# Patient Record
Sex: Female | Born: 1935 | Race: Black or African American | Hispanic: No | Marital: Single | Smoking: Never smoker
Health system: Southern US, Community
[De-identification: ages and names within clinical notes are randomized; demographics above are authoritative.]

## PROBLEM LIST (undated history)

## (undated) DIAGNOSIS — K219 Gastro-esophageal reflux disease without esophagitis: Secondary | ICD-10-CM

## (undated) DIAGNOSIS — M353 Polymyalgia rheumatica: Secondary | ICD-10-CM

## (undated) DIAGNOSIS — R011 Cardiac murmur, unspecified: Secondary | ICD-10-CM

## (undated) DIAGNOSIS — G629 Polyneuropathy, unspecified: Secondary | ICD-10-CM

## (undated) DIAGNOSIS — M316 Other giant cell arteritis: Secondary | ICD-10-CM

## (undated) DIAGNOSIS — E278 Other specified disorders of adrenal gland: Secondary | ICD-10-CM

## (undated) DIAGNOSIS — I5189 Other ill-defined heart diseases: Secondary | ICD-10-CM

## (undated) DIAGNOSIS — M8589 Other specified disorders of bone density and structure, multiple sites: Secondary | ICD-10-CM

## (undated) DIAGNOSIS — Z7960 Long term (current) use of unspecified immunomodulators and immunosuppressants: Secondary | ICD-10-CM

## (undated) DIAGNOSIS — Z87442 Personal history of urinary calculi: Secondary | ICD-10-CM

## (undated) DIAGNOSIS — Z7902 Long term (current) use of antithrombotics/antiplatelets: Secondary | ICD-10-CM

## (undated) DIAGNOSIS — I10 Essential (primary) hypertension: Secondary | ICD-10-CM

## (undated) DIAGNOSIS — R609 Edema, unspecified: Secondary | ICD-10-CM

## (undated) DIAGNOSIS — R0789 Other chest pain: Secondary | ICD-10-CM

## (undated) DIAGNOSIS — N200 Calculus of kidney: Secondary | ICD-10-CM

## (undated) DIAGNOSIS — Z79899 Other long term (current) drug therapy: Secondary | ICD-10-CM

## (undated) DIAGNOSIS — I700 Atherosclerosis of aorta: Secondary | ICD-10-CM

## (undated) DIAGNOSIS — E119 Type 2 diabetes mellitus without complications: Secondary | ICD-10-CM

## (undated) DIAGNOSIS — E785 Hyperlipidemia, unspecified: Secondary | ICD-10-CM

## (undated) DIAGNOSIS — M109 Gout, unspecified: Secondary | ICD-10-CM

## (undated) DIAGNOSIS — R600 Localized edema: Secondary | ICD-10-CM

## (undated) HISTORY — PX: SMALL INTESTINE SURGERY: SHX150

---

## 2005-02-03 ENCOUNTER — Emergency Department: Payer: Self-pay | Admitting: Unknown Physician Specialty

## 2009-03-27 ENCOUNTER — Observation Stay: Payer: Self-pay | Admitting: Internal Medicine

## 2009-05-04 ENCOUNTER — Ambulatory Visit: Payer: Self-pay | Admitting: Internal Medicine

## 2009-05-12 ENCOUNTER — Ambulatory Visit: Payer: Self-pay | Admitting: Gastroenterology

## 2009-07-14 ENCOUNTER — Ambulatory Visit: Payer: Self-pay | Admitting: Gastroenterology

## 2009-10-11 ENCOUNTER — Ambulatory Visit: Payer: Self-pay | Admitting: Otolaryngology

## 2009-10-28 ENCOUNTER — Ambulatory Visit: Payer: Self-pay | Admitting: Gastroenterology

## 2010-01-10 ENCOUNTER — Ambulatory Visit: Payer: Self-pay | Admitting: Otolaryngology

## 2011-01-12 ENCOUNTER — Ambulatory Visit: Payer: Self-pay | Admitting: Otolaryngology

## 2012-07-09 ENCOUNTER — Ambulatory Visit: Payer: Self-pay | Admitting: Internal Medicine

## 2014-10-22 ENCOUNTER — Ambulatory Visit: Payer: Self-pay | Admitting: Internal Medicine

## 2017-06-23 ENCOUNTER — Encounter: Payer: Self-pay | Admitting: Emergency Medicine

## 2017-06-23 ENCOUNTER — Emergency Department
Admission: EM | Admit: 2017-06-23 | Discharge: 2017-06-23 | Disposition: A | Discharge status: Home / Self Care | Payer: Medicare Other | Attending: Emergency Medicine | Admitting: Emergency Medicine

## 2017-06-23 DIAGNOSIS — E119 Type 2 diabetes mellitus without complications: Secondary | ICD-10-CM | POA: Insufficient documentation

## 2017-06-23 DIAGNOSIS — L237 Allergic contact dermatitis due to plants, except food: Secondary | ICD-10-CM | POA: Insufficient documentation

## 2017-06-23 DIAGNOSIS — R21 Rash and other nonspecific skin eruption: Secondary | ICD-10-CM | POA: Diagnosis present

## 2017-06-23 DIAGNOSIS — I10 Essential (primary) hypertension: Secondary | ICD-10-CM | POA: Insufficient documentation

## 2017-06-23 HISTORY — DX: Type 2 diabetes mellitus without complications: E11.9

## 2017-06-23 HISTORY — DX: Essential (primary) hypertension: I10

## 2017-06-23 LAB — GLUCOSE, CAPILLARY: GLUCOSE-CAPILLARY: 102 mg/dL — AB (ref 65–99)

## 2017-06-23 MED ORDER — DEXAMETHASONE SODIUM PHOSPHATE 10 MG/ML IJ SOLN
10.0000 mg | Freq: Once | INTRAMUSCULAR | Status: AC
  Administered 2017-06-23: 10 mg via INTRAMUSCULAR
  Filled 2017-06-23: qty 1

## 2017-06-23 MED ORDER — HYDROXYZINE HCL 10 MG/5ML PO SYRP: 7.5000 mg | ORAL_SOLUTION | Freq: Three times a day (TID) | ORAL | 0 refills | Status: DC | PRN

## 2017-06-23 MED ORDER — HYDROXYZINE HCL 25 MG PO TABS: 25.0000 mg | ORAL_TABLET | Freq: Three times a day (TID) | ORAL | 0 refills | Status: DC | PRN

## 2017-06-23 MED ORDER — HYDROCORTISONE VALERATE 0.2 % EX OINT: TOPICAL_OINTMENT | CUTANEOUS | 0 refills | Status: AC

## 2017-06-23 MED ORDER — HYDROXYZINE HCL 50 MG PO TABS
50.0000 mg | ORAL_TABLET | Freq: Once | ORAL | Status: AC
  Administered 2017-06-23: 50 mg via ORAL
  Filled 2017-06-23: qty 1

## 2017-06-23 NOTE — ED Provider Notes (Signed)
[facility] Emergency Department Provider Note  ____________________________________________   First MD Initiated Contact with Patient 06/23/17 [phone]     (approximate)  I have reviewed the triage vital signs and the nursing notes.   HISTORY  Chief Complaint Rash    HPI Valerie Cochran is a 83 y.o. female patient complaining of itchy/burning rash on the upper left arm,left shoulder, left lateral neck, and right ankle. Patient state her rash for 2 days status post working in the yard 4 days ago. No palliative measures for complaint.   Past Medical History:  Diagnosis Date  . Diabetes mellitus without complication ([place])   . Hypertension     There are no active problems to display for this patient.   Past Surgical History:  Procedure Laterality Date  . SMALL INTESTINE SURGERY      Prior to Admission medications   Medication Sig Start Date End Date Taking? Authorizing Provider  hydrocortisone valerate ointment (WESTCORT) 0.2 % Apply to affected area daily 06/23/17 06/23/18  Sable Feil, PA-C  hydrOXYzine (ATARAX/VISTARIL) 25 MG tablet Take 1 tablet (25 mg total) by mouth 3 (three) times daily as needed. 06/23/17   Sable Feil, PA-C    Allergies Aspirin  No family history on file.  Social History Social History  Substance Use Topics  . Smoking status: Never Smoker  . Smokeless tobacco: Never Used  . Alcohol use No    Review of Systems  Constitutional: No fever/chills Eyes: No visual changes. ENT: No sore throat. Cardiovascular: Denies chest pain. Respiratory: Denies shortness of breath. Gastrointestinal: No abdominal pain.  No nausea, no vomiting.  No diarrhea.  No constipation. Genitourinary: Negative for dysuria. Musculoskeletal: Negative for back pain. Skin: Negative for rash. Neurological: Negative for headaches, focal weakness or numbness. Endocrine:Diabetes and hypertension Allergic/Immunilogical: Aspirin  ____________________________________________   PHYSICAL EXAM:  VITAL SIGNS: ED Triage Vitals [06/23/17 0909]  Enc Vitals Group     BP (!) 151/74     Pulse Rate 81     Resp 16     Temp 98 F (36.7 C)     Temp Source Oral     SpO2 98 %     Weight      Height      Head Circumference      Peak Flow      Pain Score      Pain Loc      Pain Edu?      Excl. in [place]?     Constitutional: Alert and oriented. Well appearing and in no acute distress. Cardiovascular: Normal rate, regular rhythm. Grossly normal heart sounds.  Good peripheral circulation. Elevated blood pressure Respiratory: Normal respiratory effort.  No retractions. Lungs CTAB. Musculoskeletal: No lower extremity tenderness nor edema.  No joint effusions. Neurologic:  Normal speech and language. No gross focal neurologic deficits are appreciated. No gait instability. Skin:  Skin is warm, dry and intact. Vesicle lesions on left upper extremity and right leg. Psychiatric: Mood and affect are normal. Speech and behavior are normal.  ____________________________________________   LABS (all labs ordered are listed, but only abnormal results are displayed)  Labs Reviewed  CBG MONITORING, ED   ____________________________________________  EKG   ____________________________________________  RADIOLOGY  No results found.  ____________________________________________   PROCEDURES  Procedure(s) performed: None  Procedures  Critical Care performed: No  ____________________________________________   INITIAL IMPRESSION / ASSESSMENT AND PLAN / ED COURSE  Pertinent labs & imaging results that were available during my  care of the patient were reviewed by me and considered in my medical decision making (see chart for details).  Contact dermatitis. Patient given discharge care instructions. Patient advised to take medications as directed. Patient Accu-Chek showed glucose level 102.  Follow-up with PCP in 3-5 days.  Advised patient to closely monitor her glucose levels due to Decadron given prior to departure. Patient given a prescription for Atarax and Westcort.      ____________________________________________   FINAL CLINICAL IMPRESSION(S) / ED DIAGNOSES  Final diagnoses:  Allergic contact dermatitis due to plants, except food      NEW MEDICATIONS STARTED DURING THIS VISIT:  New Prescriptions   HYDROCORTISONE VALERATE OINTMENT (WESTCORT) 0.2 %    Apply to affected area daily   HYDROXYZINE (ATARAX/VISTARIL) 25 MG TABLET    Take 1 tablet (25 mg total) by mouth 3 (three) times daily as needed.     Note:  This document was prepared using Dragon voice recognition software and may include unintentional dictation errors.    Sable Feil, PA-C 06/23/17 [place], [place], MD 06/24/17 [phone]

## 2017-06-23 NOTE — ED Triage Notes (Signed)
Pt to ED via POV c/o Burning rash on left upper arm, shoulder, and neck. Pt states that she also has burning behind on her right knee cap and a "blister" on the right ankle. Pt states that rash has been present for 2 days. Pt states that she was working in the yard before she noticed rash.

## 2017-10-30 DIAGNOSIS — F5101 Primary insomnia: Secondary | ICD-10-CM | POA: Insufficient documentation

## 2017-10-31 ENCOUNTER — Other Ambulatory Visit: Payer: Self-pay | Admitting: Physician Assistant

## 2017-10-31 DIAGNOSIS — Z1231 Encounter for screening mammogram for malignant neoplasm of breast: Secondary | ICD-10-CM

## 2017-11-26 ENCOUNTER — Ambulatory Visit
Admission: RE | Admit: 2017-11-26 | Discharge: 2017-11-26 | Disposition: A | Discharge status: Home / Self Care | Payer: Medicare Other | Source: Ambulatory Visit | Attending: Physician Assistant | Admitting: Physician Assistant

## 2017-11-26 DIAGNOSIS — Z1231 Encounter for screening mammogram for malignant neoplasm of breast: Secondary | ICD-10-CM | POA: Diagnosis present

## 2018-04-26 ENCOUNTER — Ambulatory Visit
Admission: RE | Admit: 2018-04-26 | Discharge: 2018-04-26 | Disposition: A | Discharge status: Home / Self Care | Payer: Medicare Other | Source: Ambulatory Visit | Attending: Family Medicine | Admitting: Family Medicine

## 2018-04-26 ENCOUNTER — Other Ambulatory Visit: Payer: Self-pay | Admitting: Family Medicine

## 2018-04-26 DIAGNOSIS — G319 Degenerative disease of nervous system, unspecified: Secondary | ICD-10-CM | POA: Diagnosis not present

## 2018-04-26 DIAGNOSIS — R200 Anesthesia of skin: Secondary | ICD-10-CM | POA: Diagnosis not present

## 2018-04-26 DIAGNOSIS — R51 Headache: Secondary | ICD-10-CM | POA: Diagnosis present

## 2018-04-26 DIAGNOSIS — S0990XA Unspecified injury of head, initial encounter: Secondary | ICD-10-CM

## 2018-04-26 DIAGNOSIS — X58XXXA Exposure to other specified factors, initial encounter: Secondary | ICD-10-CM | POA: Insufficient documentation

## 2018-04-26 DIAGNOSIS — R519 Headache, unspecified: Secondary | ICD-10-CM

## 2018-04-26 IMAGING — CT CT HEAD W/O CM
3 series · 15 of 44 positions shown, 18 images · non-contrast
Comparison: Head CT dated [DATE] and brain MR dated [DATE].

CLINICAL DATA: Headache since falling and hitting the left side of
her head yesterday.

EXAM:
CT HEAD WITHOUT CONTRAST
TECHNIQUE: Contiguous axial images were obtained from the base of the skull
through the vertex without intravenous contrast.

[Series 2: head wo · axial · 0.38mm/px · z∈[+509,+619]mm · 9 of 27 slices shown, 12 images]
[im 3/27  brain]
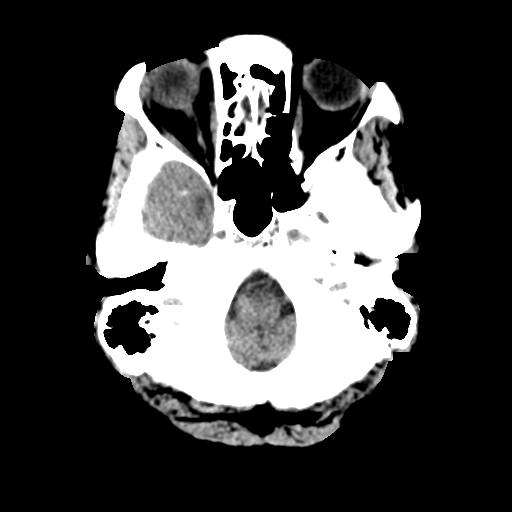
[im 3/27  bone]
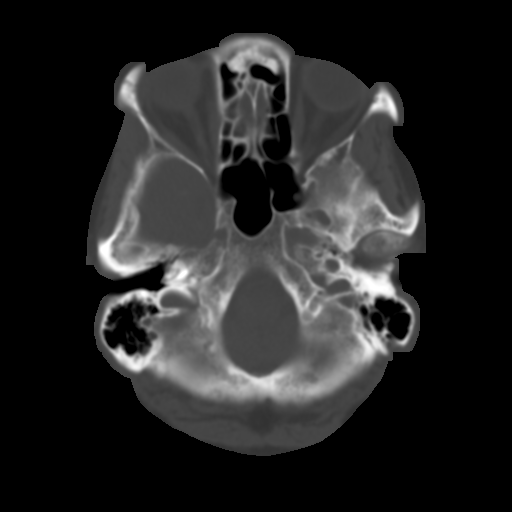
[im 6/27  brain]
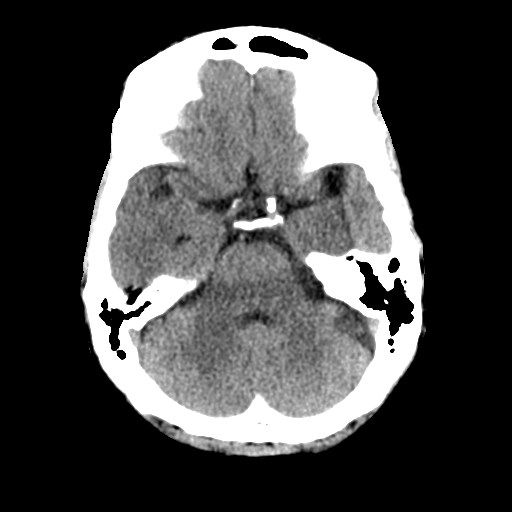
[im 8/27  brain]
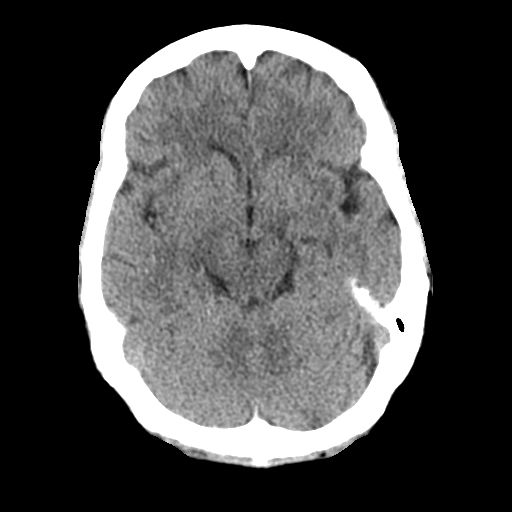
[im 11/27  brain]
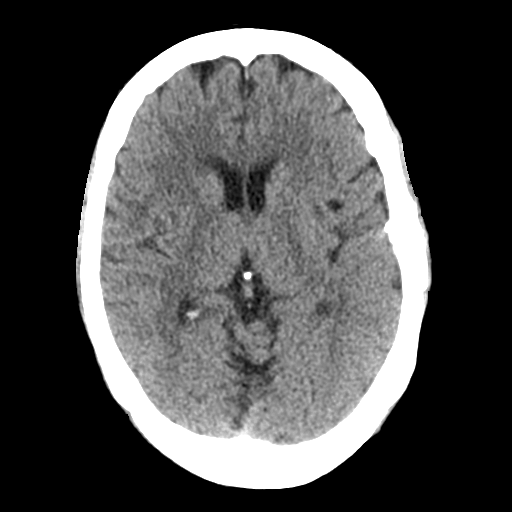
[im 14/27  brain]
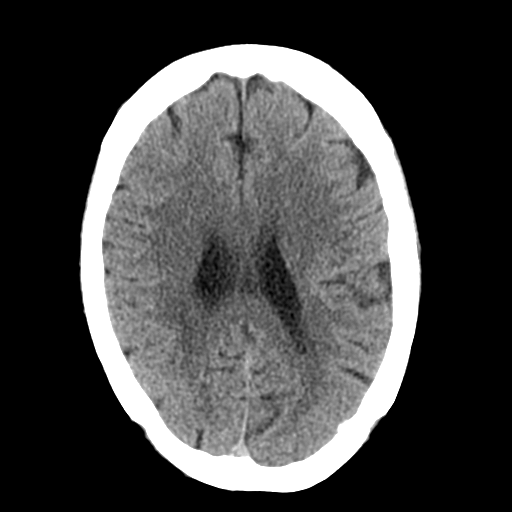
[im 14/27  bone]
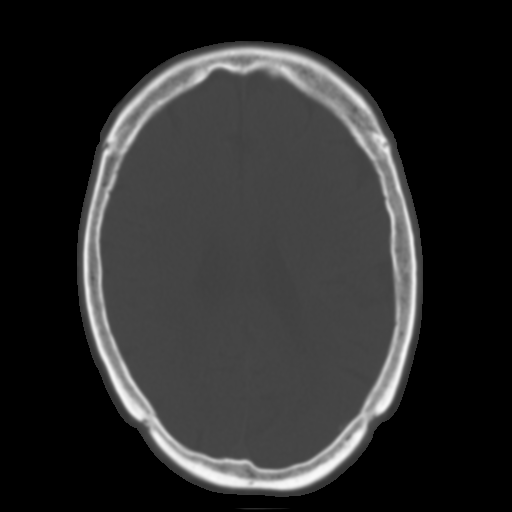
[im 17/27  brain]
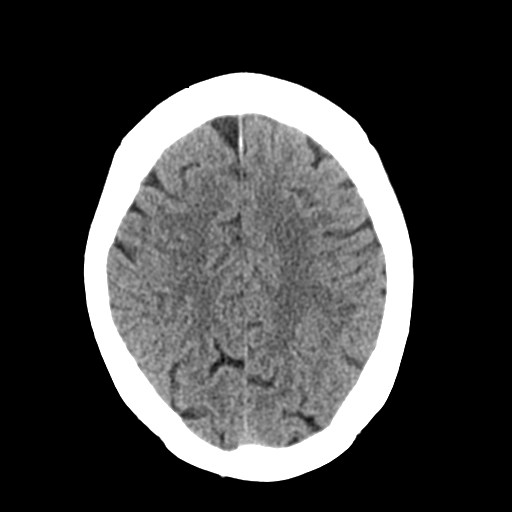
[im 20/27  brain]
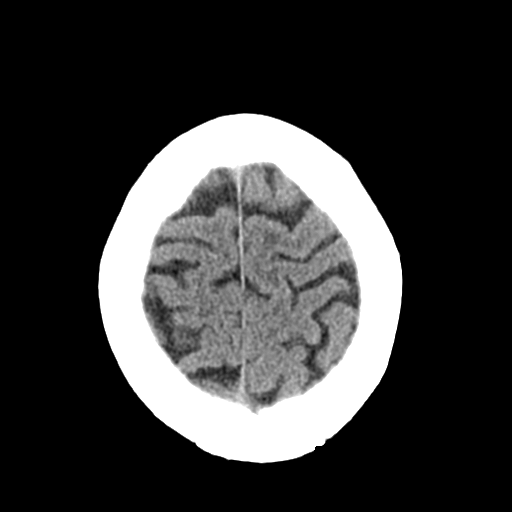
[im 22/27  brain]
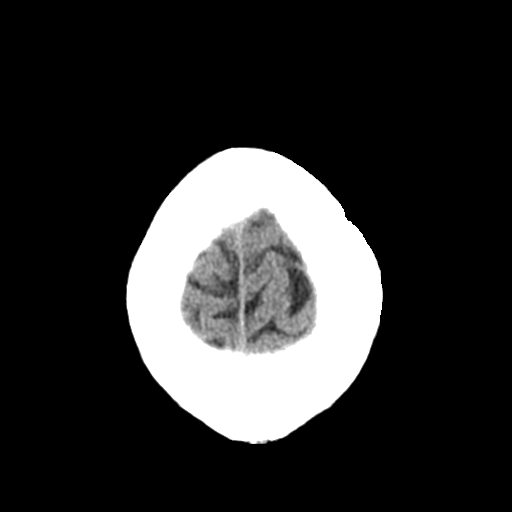
[im 25/27  brain]
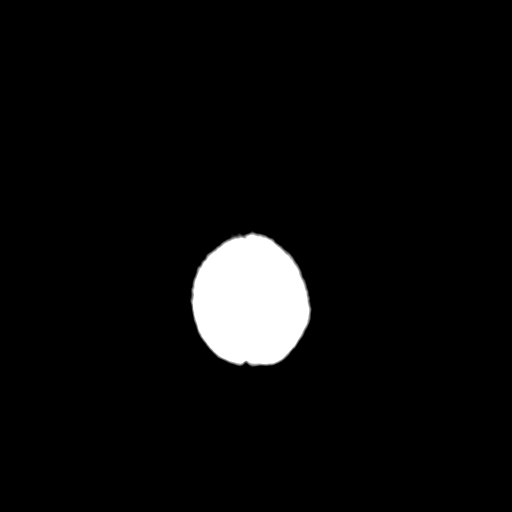
[im 25/27  bone]
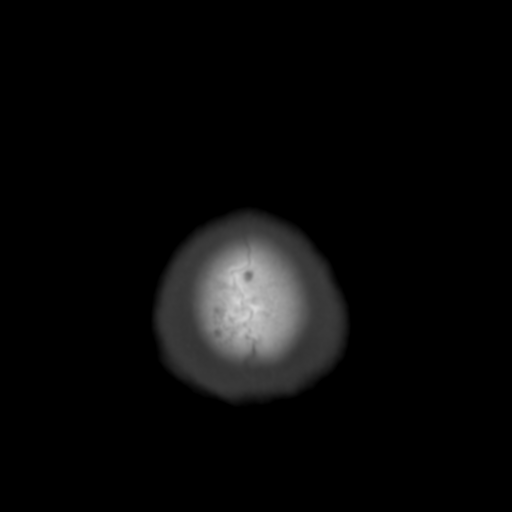

[Series 4: coronal soft tissue · coronal · 0.29mm/px · 3 of 58 slices shown]
[im 20/58  brain]
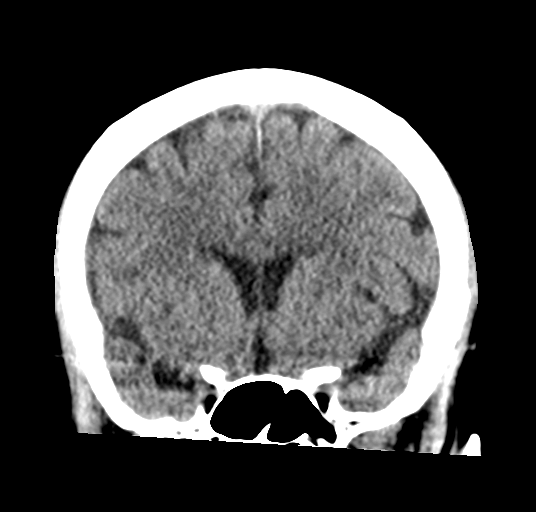
[im 26/58  brain]
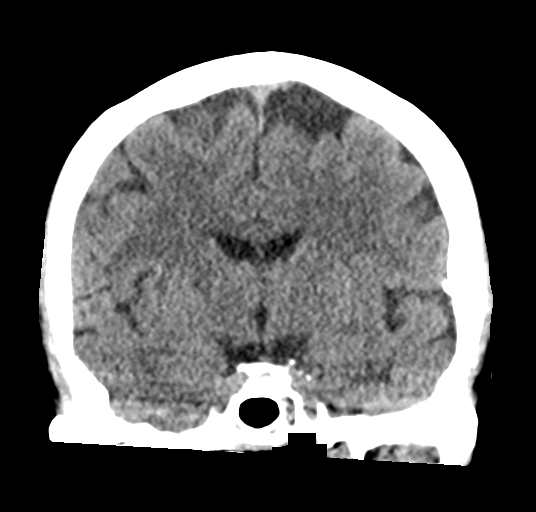
[im 32/58  brain]
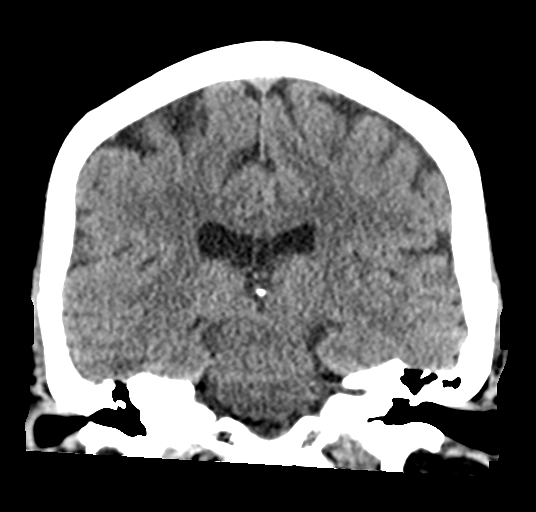

[Series 5: sagittal soft tissue · sagittal · 0.28mm/px · 3 of 45 slices shown]
[im 15/45  brain]
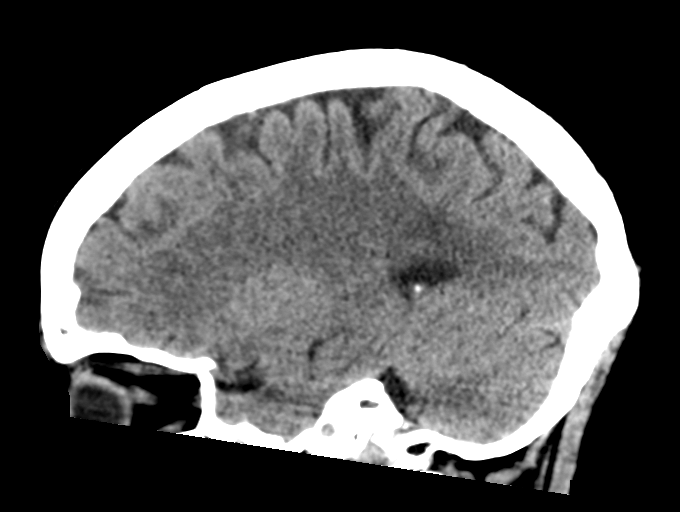
[im 23/45  brain]
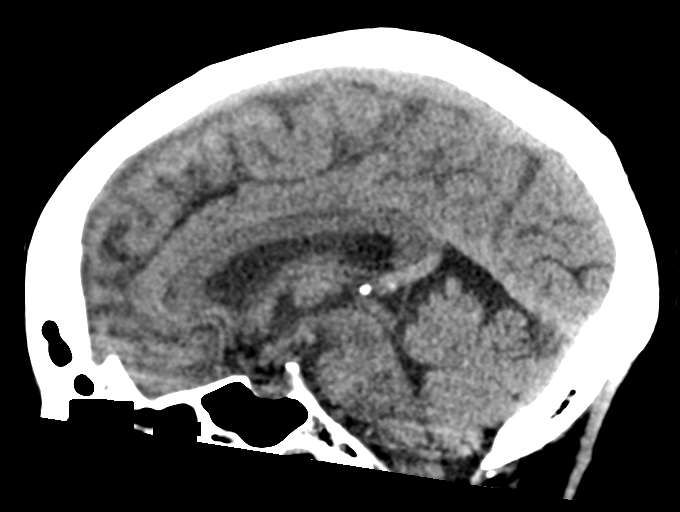
[im 30/45  brain]
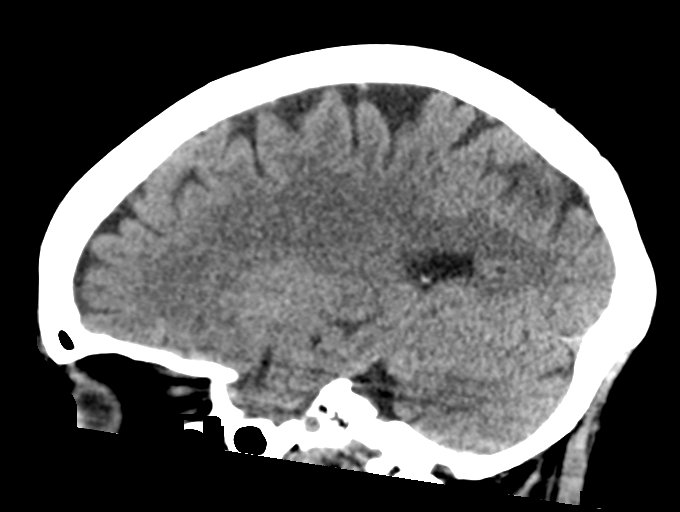

[15 of 44 positions shown; findings below may reference images not displayed]

FINDINGS: Brain: Minimally enlarged subarachnoid spaces. Patchy white matter
low density in both cerebral hemispheres. No intracranial
hemorrhage, mass lesion or CT evidence of acute infarction.

Vascular: No hyperdense vessel or unexpected calcification.

Skull: Normal. Negative for fracture or focal lesion.

Sinuses/Orbits: Status post bilateral cataract extraction.
Unremarkable included paranasal sinuses.

Other: None.
IMPRESSION: 1. No skull fracture or intracranial hemorrhage.
2. Mild chronic small vessel white matter ischemic changes in both
cerebral hemispheres.
3. Minimal diffuse cerebral and cerebellar cortical atrophy.

## 2019-07-18 ENCOUNTER — Ambulatory Visit: Payer: Self-pay | Admitting: Surgery

## 2019-07-18 ENCOUNTER — Other Ambulatory Visit
Admission: RE | Admit: 2019-07-18 | Discharge: 2019-07-18 | Disposition: A | Discharge status: Home / Self Care | Payer: Medicare Other | Source: Ambulatory Visit | Attending: Surgery | Admitting: Surgery

## 2019-07-18 ENCOUNTER — Other Ambulatory Visit: Payer: Self-pay

## 2019-07-18 DIAGNOSIS — Z01812 Encounter for preprocedural laboratory examination: Secondary | ICD-10-CM | POA: Insufficient documentation

## 2019-07-18 DIAGNOSIS — Z20828 Contact with and (suspected) exposure to other viral communicable diseases: Secondary | ICD-10-CM | POA: Diagnosis not present

## 2019-07-18 LAB — SARS CORONAVIRUS 2 (TAT 6-24 HRS): SARS Coronavirus 2: NEGATIVE

## 2019-07-23 ENCOUNTER — Ambulatory Visit: Payer: Medicare Other | Admitting: Anesthesiology

## 2019-07-23 ENCOUNTER — Encounter
Admission: RE | Disposition: A | Discharge status: Home / Self Care | Payer: Self-pay | Source: Home / Self Care | Attending: Surgery

## 2019-07-23 ENCOUNTER — Other Ambulatory Visit: Payer: Self-pay

## 2019-07-23 ENCOUNTER — Ambulatory Visit
Admission: RE | Admit: 2019-07-23 | Discharge: 2019-07-23 | Disposition: A | Discharge status: Home / Self Care | Payer: Medicare Other | Attending: Surgery | Admitting: Surgery

## 2019-07-23 DIAGNOSIS — Z7952 Long term (current) use of systemic steroids: Secondary | ICD-10-CM | POA: Insufficient documentation

## 2019-07-23 DIAGNOSIS — R519 Headache, unspecified: Secondary | ICD-10-CM | POA: Insufficient documentation

## 2019-07-23 DIAGNOSIS — H6591 Unspecified nonsuppurative otitis media, right ear: Secondary | ICD-10-CM | POA: Insufficient documentation

## 2019-07-23 DIAGNOSIS — E119 Type 2 diabetes mellitus without complications: Secondary | ICD-10-CM | POA: Insufficient documentation

## 2019-07-23 DIAGNOSIS — Z7984 Long term (current) use of oral hypoglycemic drugs: Secondary | ICD-10-CM | POA: Diagnosis not present

## 2019-07-23 DIAGNOSIS — I10 Essential (primary) hypertension: Secondary | ICD-10-CM | POA: Diagnosis not present

## 2019-07-23 DIAGNOSIS — Z7902 Long term (current) use of antithrombotics/antiplatelets: Secondary | ICD-10-CM | POA: Insufficient documentation

## 2019-07-23 DIAGNOSIS — E785 Hyperlipidemia, unspecified: Secondary | ICD-10-CM | POA: Insufficient documentation

## 2019-07-23 DIAGNOSIS — Z79899 Other long term (current) drug therapy: Secondary | ICD-10-CM | POA: Insufficient documentation

## 2019-07-23 HISTORY — PX: ARTERY BIOPSY: SHX891

## 2019-07-23 LAB — GLUCOSE, CAPILLARY
Glucose-Capillary: 230 mg/dL — ABNORMAL HIGH (ref 70–99)
Glucose-Capillary: 232 mg/dL — ABNORMAL HIGH (ref 70–99)

## 2019-07-23 SURGERY — BIOPSY TEMPORAL ARTERY
Anesthesia: General | Site: Head | Laterality: Right

## 2019-07-23 MED ORDER — CEFAZOLIN SODIUM-DEXTROSE 2-4 GM/100ML-% IV SOLN
INTRAVENOUS | Status: AC
  Filled 2019-07-23: qty 100

## 2019-07-23 MED ORDER — CHLORHEXIDINE GLUCONATE CLOTH 2 % EX PADS: 6.0000 | MEDICATED_PAD | Freq: Once | CUTANEOUS | Status: DC

## 2019-07-23 MED ORDER — FENTANYL CITRATE (PF) 100 MCG/2ML IJ SOLN
25.0000 ug | INTRAMUSCULAR | Status: DC | PRN
  Administered 2019-07-23 (×4): 25 ug via INTRAVENOUS

## 2019-07-23 MED ORDER — ACETAMINOPHEN 325 MG PO TABS: 650.0000 mg | ORAL_TABLET | Freq: Three times a day (TID) | ORAL | 0 refills | Status: AC | PRN

## 2019-07-23 MED ORDER — CEFAZOLIN SODIUM-DEXTROSE 2-4 GM/100ML-% IV SOLN
2.0000 g | INTRAVENOUS | Status: AC
  Administered 2019-07-23: 2 g via INTRAVENOUS

## 2019-07-23 MED ORDER — ONDANSETRON HCL 4 MG/2ML IJ SOLN
INTRAMUSCULAR | Status: DC | PRN
  Administered 2019-07-23: 4 mg via INTRAVENOUS

## 2019-07-23 MED ORDER — PHENYLEPHRINE HCL (PRESSORS) 10 MG/ML IV SOLN
INTRAVENOUS | Status: DC | PRN
  Administered 2019-07-23 (×2): 100 ug via INTRAVENOUS

## 2019-07-23 MED ORDER — FENTANYL CITRATE (PF) 100 MCG/2ML IJ SOLN
INTRAMUSCULAR | Status: DC | PRN
  Administered 2019-07-23 (×4): 25 ug via INTRAVENOUS

## 2019-07-23 MED ORDER — FENTANYL CITRATE (PF) 100 MCG/2ML IJ SOLN
INTRAMUSCULAR | Status: AC
  Filled 2019-07-23: qty 2

## 2019-07-23 MED ORDER — LIDOCAINE HCL (PF) 1 % IJ SOLN
INTRAMUSCULAR | Status: AC
  Filled 2019-07-23: qty 30

## 2019-07-23 MED ORDER — EPHEDRINE SULFATE 50 MG/ML IJ SOLN
INTRAMUSCULAR | Status: DC | PRN
  Administered 2019-07-23: 10 mg via INTRAVENOUS

## 2019-07-23 MED ORDER — ONDANSETRON HCL 4 MG/2ML IJ SOLN: 4.0000 mg | Freq: Once | INTRAMUSCULAR | Status: DC | PRN

## 2019-07-23 MED ORDER — DOCUSATE SODIUM 100 MG PO CAPS: 100.0000 mg | ORAL_CAPSULE | Freq: Two times a day (BID) | ORAL | 0 refills | Status: AC | PRN

## 2019-07-23 MED ORDER — LIDOCAINE-EPINEPHRINE 1 %-1:100000 IJ SOLN
INTRAMUSCULAR | Status: AC
  Filled 2019-07-23: qty 1

## 2019-07-23 MED ORDER — FENTANYL CITRATE (PF) 100 MCG/2ML IJ SOLN
INTRAMUSCULAR | Status: AC
  Administered 2019-07-23: 13:00:00 25 ug via INTRAVENOUS
  Filled 2019-07-23: qty 2

## 2019-07-23 MED ORDER — FAMOTIDINE 20 MG PO TABS
ORAL_TABLET | ORAL | Status: AC
  Administered 2019-07-23: 10:00:00 20 mg via ORAL
  Filled 2019-07-23: qty 1

## 2019-07-23 MED ORDER — DEXMEDETOMIDINE HCL 200 MCG/2ML IV SOLN
INTRAVENOUS | Status: DC | PRN
  Administered 2019-07-23 (×3): 4 ug via INTRAVENOUS

## 2019-07-23 MED ORDER — PROPOFOL 500 MG/50ML IV EMUL
INTRAVENOUS | Status: DC | PRN
  Administered 2019-07-23: 50 ug/kg/min via INTRAVENOUS

## 2019-07-23 MED ORDER — SODIUM CHLORIDE 0.9 % IV SOLN
INTRAVENOUS | Status: DC
  Administered 2019-07-23: 10:00:00 via INTRAVENOUS

## 2019-07-23 MED ORDER — LIDOCAINE HCL 1 % IJ SOLN
INTRAMUSCULAR | Status: DC | PRN
  Administered 2019-07-23: 1.5 mL

## 2019-07-23 MED ORDER — LIDOCAINE HCL (CARDIAC) PF 100 MG/5ML IV SOSY
PREFILLED_SYRINGE | INTRAVENOUS | Status: DC | PRN
  Administered 2019-07-23: 80 mg via INTRATRACHEAL

## 2019-07-23 MED ORDER — FAMOTIDINE 20 MG PO TABS
20.0000 mg | ORAL_TABLET | Freq: Once | ORAL | Status: AC
  Administered 2019-07-23: 10:00:00 20 mg via ORAL

## 2019-07-23 SURGICAL SUPPLY — 44 items
BLADE CLIPPER SURG (BLADE) ×3 IMPLANT
BLADE SURG 15 STRL LF DISP TIS (BLADE) ×1 IMPLANT
BLADE SURG 15 STRL SS (BLADE) ×2
BLADE SURG SZ11 CARB STEEL (BLADE) ×3 IMPLANT
CNTNR SPEC 2.5X3XGRAD LEK (MISCELLANEOUS)
CONT SPEC 4OZ STER OR WHT (MISCELLANEOUS)
CONTAINER SPEC 2.5X3XGRAD LEK (MISCELLANEOUS) IMPLANT
COTTON BALL STRL MEDIUM (GAUZE/BANDAGES/DRESSINGS) ×3 IMPLANT
COVER PROBE FLX POLY STRL (MISCELLANEOUS) IMPLANT
COVER WAND RF STERILE (DRAPES) ×3 IMPLANT
DERMABOND ADVANCED (GAUZE/BANDAGES/DRESSINGS) ×2
DERMABOND ADVANCED .7 DNX12 (GAUZE/BANDAGES/DRESSINGS) ×1 IMPLANT
DRAPE LAPAROTOMY 77X122 PED (DRAPES) ×3 IMPLANT
DRSG TELFA 4X3 1S NADH ST (GAUZE/BANDAGES/DRESSINGS) ×3 IMPLANT
ELECT CAUTERY BLADE 6.4 (BLADE) ×3 IMPLANT
ELECT REM PT RETURN 9FT ADLT (ELECTROSURGICAL) ×3
ELECTRODE REM PT RTRN 9FT ADLT (ELECTROSURGICAL) ×1 IMPLANT
GLOVE BIOGEL PI IND STRL 7.0 (GLOVE) ×1 IMPLANT
GLOVE BIOGEL PI INDICATOR 7.0 (GLOVE) ×2
GLOVE SURG SYN 6.5 ES PF (GLOVE) ×6 IMPLANT
GOWN STRL REUS W/ TWL LRG LVL3 (GOWN DISPOSABLE) ×1 IMPLANT
GOWN STRL REUS W/ TWL XL LVL3 (GOWN DISPOSABLE) ×1 IMPLANT
GOWN STRL REUS W/TWL LRG LVL3 (GOWN DISPOSABLE) ×2
GOWN STRL REUS W/TWL XL LVL3 (GOWN DISPOSABLE) ×2
KIT TURNOVER KIT A (KITS) ×3 IMPLANT
LABEL OR SOLS (LABEL) ×3 IMPLANT
NEEDLE HYPO 25X1 1.5 SAFETY (NEEDLE) IMPLANT
NS IRRIG 500ML POUR BTL (IV SOLUTION) ×3 IMPLANT
PACK BASIN MINOR ARMC (MISCELLANEOUS) ×3 IMPLANT
SOL PREP PVP 2OZ (MISCELLANEOUS) ×3
SOLUTION PREP PVP 2OZ (MISCELLANEOUS) ×1 IMPLANT
SUCTION FRAZIER HANDLE 10FR (MISCELLANEOUS) ×2
SUCTION TUBE FRAZIER 10FR DISP (MISCELLANEOUS) ×1 IMPLANT
SUT MNCRL AB 4-0 PS2 18 (SUTURE) ×3 IMPLANT
SUT SILK 2 0 (SUTURE)
SUT SILK 2-0 18XBRD TIE 12 (SUTURE) IMPLANT
SUT SILK 3 0 (SUTURE)
SUT SILK 3-0 18XBRD TIE 12 (SUTURE) IMPLANT
SUT SILK 4 0 (SUTURE)
SUT SILK 4-0 18XBRD TIE 12 (SUTURE) IMPLANT
SUT VIC AB 3-0 SH 27 (SUTURE)
SUT VIC AB 3-0 SH 27X BRD (SUTURE) IMPLANT
SYR 10ML LL (SYRINGE) IMPLANT
SYR BULB IRRIG 60ML STRL (SYRINGE) ×3 IMPLANT

## 2019-07-23 NOTE — H&P (Signed)
Subjective:   CC: Right non-suppurative otitis media [H65.91]  HPI:  Valerie Cochran is a 85 y.o. female who was referred by Sheron Nightingale,* for evaluation of above. First noted several days ago.  Symptoms include: Pain is dull, pressure like sensation included right side of face.  Exacerbated by nothing specific.  Alleviated by nothing specific, including OTC .  Associated with overall malaise, fatigue.  Symptoms above have improved significantly since starting abx.  Denies every having any vision changes.     Past Medical History:  has a past medical history of Diabetes mellitus type 2, uncomplicated (CMS-HCC), Hyperlipidemia, and Hypertension.  Past Surgical History:  has a past surgical history that includes Stomach surgery.  Family History: family history includes Alcohol abuse in her brother, brother, and brother; Arthritis in her daughter, mother, and sister; Asthma in her sister; COPD in her sister and sister; Cancer in her brother and sister; Carpal tunnel syndrome in her daughter; Diabetes in her brother and brother; Gout in her mother; Heart disease in her brother and brother; Heart failure in her mother; Irritable bowel syndrome in her daughter; Liver disease in her brother and brother; Neck pain in her daughter; No Known Problems in her brother, father, maternal grandfather, maternal grandmother, paternal grandfather, and paternal grandmother; Other in her brother; Prostate cancer in her brother; Reflux disease in her daughter; Scleroderma in her daughter.  Social History:  reports that she has never smoked. She has never used smokeless tobacco. She reports that she does not drink alcohol or use drugs.  Current Medications: has a current medication list which includes the following prescription(s): amoxicillin, clopidogrel, cyclobenzaprine, dulaglutide, duloxetine, fluticasone propionate, gabapentin, losartan, metformin, mirtazapine, omeprazole, onetouch delica lancets,  potassium chloride, prednisone, tramadol, trazodone, triamcinolone, and zolpidem.  Allergies:      Allergies  Allergen Reactions  . Aspirin Hives    ROS:  A 15 point review of systems was performed and pertinent positives and negatives noted in HPI   Objective:   BP 157/86   Pulse 98   Ht 167.6 cm ('5\' 6"'$ )   Wt 78.9 kg (174 lb)   BMI 28.08 kg/m   Constitutional :  alert, appears stated age, cooperative and no distress  Lymphatics/Throat:  no asymmetry, masses, or scars  Respiratory:  clear to auscultation bilaterally  Cardiovascular:  regular rate and rhythm  Gastrointestinal: soft, non-tender; bowel sounds normal; no masses,  no organomegaly.    Musculoskeletal: Steady gait and movement  Skin: Cool and moist. Easily palpable right temporal artery, within hairline.  Psychiatric: Normal affect, non-agitated, not confused       LABS:  -ESR 114  RADS: n/a  Assessment:      Right non-suppurative otitis media [H65.91]  Plan:   1. Right non-suppurative otitis media [H65.91]  With symptoms significantly improved since starting abx, cause of initial symptoms likely from otitis media rather than temporal arteritis.  Recommended she keep rheumatology appt just in case and also to f/u with primary as needed for persistent symptoms.  Will consider temporal artery biopsy If rheum believes suspicion is high enough.  Will need to hold plavix if we schedule.  UPDATE: Rheum recommended temporal artery biopsy, RIGHT. Alternatives include continued observation.  Benefits include possible  pathologic evaluation. Discussed the risk of surgery including chronic pain, post-op infxn, poor cosmesis, poor/delayed wound healing, and possible re-operation to address said risks. The risks of general anesthetic, if used, includes MI, CVA, sudden death or even reaction to anesthetic medications also  discussed.  Typical post-op recovery time were also discussed.

## 2019-07-23 NOTE — Op Note (Signed)
Pre-Op Dx: Right temporal pain Post-Op Dx: same Anesthesia: MAC EBL: minimal Complications:  none apparent Specimen: Right temporal artery Procedure: excisional biopsy of Right temporal artery Surgeon: Lysle Pearl  Indication for procedure: Patient presenting with symptoms above concerning for possible temporal arteritis surgery requested for biopsy to aid in diagnosis.  Description of Procedure:  Patient transferred to the OR table in supine position.  Preoperative antibiotics given.  Area sterilized and draped in usual position.  Timeout performed.   Local infused to area previously marked after confirming palpable temporal artery as well as using Doppler.  4cm incision made through dermis with 15blade and the palpable temporal artery was noted overlying the fascia.  Dissection continued to obtain adequate specimen length which in the end resulted in 2.6 cm.  The 2 ends and one branch were then ligated with 3-0 silk prior to transecting at 2end and the specimen was passed off the operative field pending pathology.  Wound hemostasis noted, then skin closed with running 4-0 monocryl in subcuticular fashion.  Wound then dressed with dermabond.  Pt tolerated procedure well, and transferred to PACU in stable condition. Sponge and instrument count correct at end of procedure.

## 2019-07-23 NOTE — Transfer of Care (Signed)
Immediate Anesthesia Transfer of Care Note  Patient: Valerie Cochran  Procedure(s) Performed: BIOPSY TEMPORAL ARTERY (Right Head)  Patient Location: PACU  Anesthesia Type:General  Level of Consciousness: awake, alert  and oriented  Airway & Oxygen Therapy: Patient connected to face mask oxygen  Post-op Assessment: Post -op Vital signs reviewed and stable  Post vital signs: stable  Last Vitals:  Vitals Value Taken Time  BP 150/125 07/23/19 1209  Temp    Pulse 85 07/23/19 1209  Resp 28 07/23/19 1209  SpO2 100 % 07/23/19 1209  Vitals shown include unvalidated device data.  Last Pain:  Vitals:   07/23/19 0913  TempSrc: Temporal  PainSc: 0-No pain         Complications: No apparent anesthesia complications

## 2019-07-23 NOTE — Interval H&P Note (Signed)
History and Physical Interval Note:  07/23/2019 9:55 AM  Valerie Cochran  has presented today for surgery, with the diagnosis of R51.9 - Headaches.  The various methods of treatment have been discussed with the patient and family. After consideration of risks, benefits and other options for treatment, the patient has consented to  Procedure(s): BIOPSY TEMPORAL ARTERY (N/A) as a surgical intervention.  The patient's history has been reviewed, patient examined, no change in status, stable for surgery.  I have reviewed the patient's chart and labs.  Questions were answered to the patient's satisfaction.     Jiro Kiester Lysle Pearl

## 2019-07-23 NOTE — Discharge Instructions (Signed)
Biopsy, Care After This sheet gives you information about how to care for yourself after your procedure. Your health care provider may also give you more specific instructions. If you have problems or questions, contact your health care provider. What can I expect after the procedure? After the procedure, it is common to have:  Soreness.  Bruising.  Itching. Follow these instructions at home: site care Follow instructions from your health care provider about how to take care of your site. Make sure you:  Wash your hands with soap and water before and after you change your bandage (dressing). If soap and water are not available, use hand sanitizer.  Leave stitches (sutures), skin glue, or adhesive strips in place. These skin closures may need to stay in place for 2 weeks or longer. If adhesive strip edges start to loosen and curl up, you may trim the loose edges. Do not remove adhesive strips completely unless your health care provider tells you to do that.  If the area bleeds or bruises, apply gentle pressure for 10 minutes.  OK TO SHOWER IN 24HRS  Check your site every day for signs of infection. Check for:  Redness, swelling, or pain.  Fluid or blood.  Warmth.  Pus or a bad smell.  General instructions  Rest and then return to your normal activities as told by your health care provider.  RESUME Plavix(Clopidogrel) 48hrs after surgery   tylenol as needed for discomfort.    Use narcotics, if prescribed, only when tylenol and motrin is not enough to control pain.   325-650mg  every 8hrs to max of 3000mg /24hrs (including the 325mg  in every norco dose) for the tylenol.     Keep all follow-up visits as told by your health care provider. This is important. Contact a health care provider if:  You have redness, swelling, or pain around your site.  You have fluid or blood coming from your site.  Your site feels warm to the touch.  You have pus or a bad smell coming from  your site.  You have a fever.  Your sutures, skin glue, or adhesive strips loosen or come off sooner than expected. Get help right away if:  You have bleeding that does not stop with pressure or a dressing. Summary  After the procedure, it is common to have some soreness, bruising, and itching at the site.  Follow instructions from your health care provider about how to take care of your site.  Check your site every day for signs of infection.  Contact a health care provider if you have redness, swelling, or pain around your site, or your site feels warm to the touch.  Keep all follow-up visits as told by your health care provider. This is important. This information is not intended to replace advice given to you by your health care provider. Make sure you discuss any questions you have with your health care provider. Document Released: 10/22/2015 Document Revised: 03/25/2018 Document Reviewed: 03/25/2018 Elsevier Interactive Patient Education  2019 [place]   1) The drugs that you were given will stay in your system until tomorrow so for the next 24 hours you should not:  A) Drive an automobile B) Make any legal decisions C) Drink any alcoholic beverage   2) You may resume regular meals tomorrow.  Today it is better to start with liquids and gradually work up to solid foods.  You may eat anything you prefer, but it is better  to start with liquids, then soup and crackers, and gradually work up to solid foods.   3) Please notify your doctor immediately if you have any unusual bleeding, trouble breathing, redness and pain at the surgery site, drainage, fever, or pain not relieved by medication.    4) Additional Instructions:        Please contact your physician with any problems or Same Day Surgery at [phone], Monday through Friday 6 am to 4 pm, or [place] at [facility] number at [phone].

## 2019-07-23 NOTE — OR Nursing (Addendum)
Dr Lysle Pearl in to see patient and discussed Plavix with patient, patient states "I have not taken it in months"  Dr Kayleen Memos called as well to review hx. After Dr Kayleen Memos reviewed recent labs and EKG no new orders. Patient has a bruise on left leg from knee to ankle where she states a table fell on in last Thursday, denies pain and is able to walk on it without difficulty.

## 2019-07-23 NOTE — Anesthesia Preprocedure Evaluation (Addendum)
Anesthesia Evaluation  Patient identified by MRN, date of birth, ID band Patient awake    Reviewed: Allergy & Precautions, NPO status , Patient's Chart, lab work & pertinent test results  Airway Mallampati: II  TM Distance: >3 FB     Dental  (+) Upper Dentures, Lower Dentures   Pulmonary neg pulmonary ROS,    Pulmonary exam normal        Cardiovascular hypertension, Normal cardiovascular exam     Neuro/Psych negative neurological ROS  negative psych ROS   GI/Hepatic negative GI ROS, Neg liver ROS,   Endo/Other  diabetes  Renal/GU negative Renal ROS  negative genitourinary   Musculoskeletal negative musculoskeletal ROS (+)   Abdominal Normal abdominal exam  (+)   Peds negative pediatric ROS (+)  Hematology   Anesthesia Other Findings Past Medical History: No date: Diabetes mellitus without complication (HCC) No date: Hypertension  Reproductive/Obstetrics                            Anesthesia Physical Anesthesia Plan  ASA: II  Anesthesia Plan: General   Post-op Pain Management:    Induction: Intravenous  PONV Risk Score and Plan: TIVA and Propofol infusion  Airway Management Planned: Nasal Cannula  Additional Equipment:   Intra-op Plan:   Post-operative Plan:   Informed Consent: I have reviewed the patients History and Physical, chart, labs and discussed the procedure including the risks, benefits and alternatives for the proposed anesthesia with the patient or authorized representative who has indicated his/her understanding and acceptance.     Dental advisory given  Plan Discussed with: CRNA and Surgeon  Anesthesia Plan Comments:        Anesthesia Quick Evaluation

## 2019-07-23 NOTE — Anesthesia Post-op Follow-up Note (Signed)
Anesthesia QCDR form completed.        

## 2019-07-24 LAB — SURGICAL PATHOLOGY

## 2019-07-25 NOTE — Anesthesia Postprocedure Evaluation (Signed)
Anesthesia Post Note  Patient: Valerie Cochran  Procedure(s) Performed: BIOPSY TEMPORAL ARTERY (Right Head)  Patient location during evaluation: PACU Anesthesia Type: General Level of consciousness: awake and alert and oriented Pain management: pain level controlled Vital Signs Assessment: post-procedure vital signs reviewed and stable Respiratory status: spontaneous breathing Cardiovascular status: blood pressure returned to baseline Anesthetic complications: no     Last Vitals:  Vitals:   07/23/19 1253 07/23/19 1307  BP: (!) 142/79 (!) 171/79  Pulse: 64 62  Resp: 14 18  Temp: (!) 36.3 C (!) 36.4 C  SpO2: 95% 96%    Last Pain:  Vitals:   07/24/19 0834  TempSrc:   PainSc: 1                  Mayar Whittier,Steinhauser

## 2019-08-07 DIAGNOSIS — M316 Other giant cell arteritis: Secondary | ICD-10-CM | POA: Insufficient documentation

## 2019-08-07 DIAGNOSIS — M8589 Other specified disorders of bone density and structure, multiple sites: Secondary | ICD-10-CM | POA: Insufficient documentation

## 2019-08-20 ENCOUNTER — Other Ambulatory Visit: Payer: Self-pay | Admitting: Physician Assistant

## 2019-08-20 DIAGNOSIS — S8012XD Contusion of left lower leg, subsequent encounter: Secondary | ICD-10-CM

## 2019-08-21 ENCOUNTER — Other Ambulatory Visit: Payer: Self-pay

## 2019-08-21 ENCOUNTER — Ambulatory Visit
Admission: RE | Admit: 2019-08-21 | Discharge: 2019-08-21 | Disposition: A | Discharge status: Home / Self Care | Payer: Medicare Other | Source: Ambulatory Visit | Attending: Physician Assistant | Admitting: Physician Assistant

## 2019-08-21 DIAGNOSIS — S8012XD Contusion of left lower leg, subsequent encounter: Secondary | ICD-10-CM | POA: Diagnosis present

## 2019-09-10 ENCOUNTER — Emergency Department: Payer: Medicare Other

## 2019-09-10 ENCOUNTER — Other Ambulatory Visit: Payer: Self-pay

## 2019-09-10 ENCOUNTER — Emergency Department
Admission: EM | Admit: 2019-09-10 | Discharge: 2019-09-10 | Disposition: A | Discharge status: Home / Self Care | Payer: Medicare Other | Attending: Emergency Medicine | Admitting: Emergency Medicine

## 2019-09-10 DIAGNOSIS — E119 Type 2 diabetes mellitus without complications: Secondary | ICD-10-CM | POA: Diagnosis not present

## 2019-09-10 DIAGNOSIS — H5711 Ocular pain, right eye: Secondary | ICD-10-CM | POA: Diagnosis present

## 2019-09-10 DIAGNOSIS — L03213 Periorbital cellulitis: Secondary | ICD-10-CM | POA: Insufficient documentation

## 2019-09-10 DIAGNOSIS — I10 Essential (primary) hypertension: Secondary | ICD-10-CM | POA: Diagnosis not present

## 2019-09-10 DIAGNOSIS — Z79899 Other long term (current) drug therapy: Secondary | ICD-10-CM | POA: Diagnosis not present

## 2019-09-10 LAB — BASIC METABOLIC PANEL
Anion gap: 13 (ref 5–15)
BUN: 12 mg/dL (ref 8–23)
CO2: 25 mmol/L (ref 22–32)
Calcium: 8.5 mg/dL — ABNORMAL LOW (ref 8.9–10.3)
Chloride: 101 mmol/L (ref 98–111)
Creatinine, Ser: 0.58 mg/dL (ref 0.44–1.00)
GFR calc Af Amer: >60 mL/min (ref >60)
GFR calc non Af Amer: >60 mL/min (ref >60)
Glucose, Bld: 162 mg/dL — ABNORMAL HIGH (ref 70–99)
Potassium: 3.2 mmol/L — ABNORMAL LOW (ref 3.5–5.1)
Sodium: 139 mmol/L (ref 135–145)

## 2019-09-10 LAB — CBC WITH DIFFERENTIAL/PLATELET
Abs Immature Granulocytes: 0.06 10*3/uL (ref 0.00–0.07)
Basophils Absolute: 0 10*3/uL (ref 0.0–0.1)
Basophils Relative: 0 %
Eosinophils Absolute: 0 10*3/uL (ref 0.0–0.5)
Eosinophils Relative: 0 %
HCT: 38.3 % (ref 36.0–46.0)
Hemoglobin: 12.5 g/dL (ref 12.0–15.0)
Immature Granulocytes: 1 %
Lymphocytes Relative: 27 %
Lymphs Abs: 2.1 10*3/uL (ref 0.7–4.0)
MCH: 29.1 pg (ref 26.0–34.0)
MCHC: 32.6 g/dL (ref 30.0–36.0)
MCV: 89.3 fL (ref 80.0–100.0)
Monocytes Absolute: 0.5 10*3/uL (ref 0.1–1.0)
Monocytes Relative: 6 %
Neutro Abs: 5.3 10*3/uL (ref 1.7–7.7)
Neutrophils Relative %: 66 %
Platelets: 249 10*3/uL (ref 150–400)
RBC: 4.29 MIL/uL (ref 3.87–5.11)
RDW: 14.7 % (ref 11.5–15.5)
WBC: 8 10*3/uL (ref 4.0–10.5)
nRBC: 0 % (ref 0.0–0.2)

## 2019-09-10 MED ORDER — DOXYCYCLINE HYCLATE 100 MG PO TABS
100.0000 mg | ORAL_TABLET | Freq: Once | ORAL | Status: AC
  Administered 2019-09-10: 22:00:00 100 mg via ORAL
  Filled 2019-09-10: qty 1

## 2019-09-10 MED ORDER — AMOXICILLIN-POT CLAVULANATE 875-125 MG PO TABS: 1.0000 | ORAL_TABLET | Freq: Two times a day (BID) | ORAL | 0 refills | Status: AC

## 2019-09-10 MED ORDER — HYDROCODONE-ACETAMINOPHEN 5-325 MG PO TABS: 1.0000 | ORAL_TABLET | Freq: Four times a day (QID) | ORAL | 0 refills | Status: DC | PRN

## 2019-09-10 MED ORDER — OXYCODONE-ACETAMINOPHEN 5-325 MG PO TABS
2.0000 | ORAL_TABLET | Freq: Once | ORAL | Status: AC
  Administered 2019-09-10: 2 via ORAL
  Filled 2019-09-10: qty 2

## 2019-09-10 MED ORDER — IOHEXOL 300 MG/ML  SOLN
75.0000 mL | Freq: Once | INTRAMUSCULAR | Status: AC | PRN
  Administered 2019-09-10: 19:00:00 75 mL via INTRAVENOUS

## 2019-09-10 MED ORDER — AMOXICILLIN-POT CLAVULANATE 875-125 MG PO TABS
1.0000 | ORAL_TABLET | Freq: Once | ORAL | Status: AC
  Administered 2019-09-10: 1 via ORAL
  Filled 2019-09-10: qty 1

## 2019-09-10 MED ORDER — DOXYCYCLINE MONOHYDRATE 100 MG PO CAPS: 100.0000 mg | ORAL_CAPSULE | Freq: Two times a day (BID) | ORAL | 0 refills | Status: DC

## 2019-09-10 NOTE — ED Triage Notes (Signed)
Pt comes via POV from [facility] with possible orbital cellulitis. Pt states she had a biopsy completed to right side of head. Pt states after that she had the swelling and redness to her right eye. Pt states this was about a month ago.  Pt states severe pain. Pt states some blurry vision.

## 2019-09-10 NOTE — ED Provider Notes (Signed)
[facility] Emergency Department Provider Note   ____________________________________________   First MD Initiated Contact with Patient 09/10/19 2133     (approximate)  I have reviewed the triage vital signs and the nursing notes.   HISTORY  Chief Complaint Eye Pain    HPI Valerie Cochran is a 85 y.o. female who was sent from [facility] for concerns of orbital cellulitis.  Patient reports her vision is within normal limits but her eye is very painful especially if she looks up and to the right.  Med B looks okay potassium slightly low but not bad WBC count is normal CT of the orbit shows periorbital cellulitis.  I discussed this carefully with the patient and her daughter.  Patient is doing well is afebrile her blood pressure is high though we will give her something for pain and Augmentin and doxycycline.  Patient will return tomorrow for recheck unless she is much worse in which case she will come back sooner.         Past Medical History:  Diagnosis Date  . Diabetes mellitus without complication ([place])   . Hypertension     There are no active problems to display for this patient.   Past Surgical History:  Procedure Laterality Date  . ARTERY BIOPSY Right 07/23/2019   Procedure: BIOPSY TEMPORAL ARTERY;  Surgeon: Benjamine Sprague, DO;  Location: [facility] ORS;  Service: General;  Laterality: Right;  . SMALL INTESTINE SURGERY      Prior to Admission medications   Medication Sig Start Date End Date Taking? Authorizing Provider  amoxicillin-clavulanate (AUGMENTIN) 875-125 MG tablet Take 1 tablet by mouth 2 (two) times daily for 10 days. 09/10/19 09/20/19  Nena Polio, MD  clopidogrel (PLAVIX) 75 MG tablet Take 75 mg by mouth daily.    [provider]  cyclobenzaprine (FLEXERIL) 10 MG tablet Take 10 mg by mouth 3 (three) times daily as needed for muscle spasms.    [provider]  doxycycline (MONODOX) 100 MG capsule Take 1 capsule  (100 mg total) by mouth 2 (two) times daily. 09/10/19   Nena Polio, MD  Dulaglutide 0.75 MG/0.5ML SOPN Inject 0.75 mg into the skin every Thursday. 09/03/18   [provider]  DULoxetine (CYMBALTA) 30 MG capsule Take 30 mg by mouth daily. 12/31/17   [provider]  fluticasone (FLONASE) 50 MCG/ACT nasal spray Place 2 sprays into the nose daily.    [provider]  gabapentin (NEURONTIN) 300 MG capsule Take 300 mg by mouth at bedtime. 12/25/16   [provider]  HYDROcodone-acetaminophen (NORCO/VICODIN) 5-325 MG tablet Take 1 tablet by mouth every 6 (six) hours as needed for moderate pain. 09/10/19   Nena Polio, MD  losartan (COZAAR) 100 MG tablet Take 100 mg by mouth daily. 04/16/19   [provider]  metFORMIN (GLUCOPHAGE-XR) 500 MG 24 hr tablet Take 1,000 mg by mouth every evening. 04/25/19 04/24/20  [provider]  mirtazapine (REMERON) 7.5 MG tablet Take 7.5 mg by mouth every evening. 07/11/19   [provider]  omeprazole (PRILOSEC) 20 MG capsule Take 20 mg by mouth 2 (two) times daily.    [provider]  potassium chloride SA (KLOR-CON) 20 MEQ tablet Take 20 mEq by mouth daily as needed.    [provider]  rosuvastatin (CRESTOR) 5 MG tablet Take 5 mg by mouth daily.    [provider]  traMADol (ULTRAM) 50 MG tablet Take 50 mg by mouth  every 12 (twelve) hours as needed for moderate pain.    [provider]  traZODone (DESYREL) 50 MG tablet Take 50 mg by mouth at bedtime as needed for sleep.    [provider]  triamcinolone cream (KENALOG) 0.5 % Apply 1 application topically 2 (two) times daily.    [provider]    Allergies Aspirin  Family History  Problem Relation Age of Onset  . Breast cancer Neg Hx     Social History Social History   Tobacco Use  . Smoking status: Never Smoker  . Smokeless tobacco: Never Used  Substance Use Topics  . Alcohol use: No  .  Drug use: No    Review of Systems  Constitutional: No fever/chills Eyes: No visual changes. ENT: No sore throat. Cardiovascular: Denies chest pain. Respiratory: Denies shortness of breath. Gastrointestinal: No abdominal pain.  No nausea, no vomiting.  No diarrhea.  No constipation. Genitourinary: Negative for dysuria. Musculoskeletal: Negative for back pain. Skin: Negative for rash. Neurological: Negative for headaches, focal weakness   ____________________________________________   PHYSICAL EXAM:  VITAL SIGNS: ED Triage Vitals  Enc Vitals Group     BP 09/10/19 1607 (!) 180/89     Pulse Rate 09/10/19 1607 62     Resp 09/10/19 1607 19     Temp 09/10/19 1607 98.6 F (37 C)     Temp Source 09/10/19 1607 Oral     SpO2 09/10/19 1607 99 %     Weight 09/10/19 1609 165 lb (74.8 kg)     Height 09/10/19 1609 5\' 4"  (1.626 m)     Head Circumference --      Peak Flow --      Pain Score 09/10/19 1609 10     Pain Loc --      Pain Edu? --      Excl. in [place]? --     Constitutional: Alert and oriented. Well appearing and in no acute distress. Eyes: Right conjunctiva is injected and slightly edematous.  The periorbital tissues around the right eye are somewhat painful red and swollen.  Eye movement is not limited.  Pupils are reactive and round. Head: Atraumatic. Nose: No congestion/rhinnorhea. Mouth/Throat: Mucous membranes are moist.  Oropharynx non-erythematous. Neck: No stridor. Cardiovascular: Normal rate, regular rhythm. Grossly normal heart sounds.  Good peripheral circulation. Respiratory: Normal respiratory effort.  No retractions. Lungs CTAB. Gastrointestinal: Soft and nontender. No distention. No abdominal bruits. No CVA tenderness. Musculoskeletal: No lower extremity tenderness nor edema. . Neurologic:  Normal speech and language. No gross focal neurologic deficits are appreciated.  No gait instability Skin:  Skin is warm, dry and intact. No rash noted.   ____________________________________________   LABS (all labs ordered are listed, but only abnormal results are displayed)  Labs Reviewed  BASIC METABOLIC PANEL - Abnormal; Notable for the following components:      Result Value   Potassium 3.2 (*)    Glucose, Bld 162 (*)    Calcium 8.5 (*)    All other components within normal limits  CBC WITH DIFFERENTIAL/PLATELET   ____________________________________________  EKG   ____________________________________________  RADIOLOGY  ED MD interpretation: CT read by radiology reviewed by me and we discussed the films afterwards.  Looks to the radiologist like it is preseptal cellulitis.    Official radiology report(s): Ct Orbits W Contrast  Result Date: 09/10/2019 CLINICAL DATA:  85 year old female status post biopsy a right side temporal artery biopsy in October. Subsequent swelling and redness to the right eye  EXAM: CT ORBITS WITH CONTRAST TECHNIQUE: Multidetector CT images was performed according to the standard protocol following intravenous contrast administration. CONTRAST:  82mL OMNIPAQUE IOHEXOL 300 MG/ML  SOLN COMPARISON:  Head CT 04/26/2018. FINDINGS: Orbits: Intact orbital walls. There is asymmetric right side periorbital soft tissue thickening and swelling (series 3, image 35). This is inseparable from the right lacrimal gland. But there is no postseptal involvement. No soft tissue gas or fluid collection. The globes appear symmetric and intact. Postseptal orbits soft tissues appear symmetric and within normal limits. Other osseous structures: Absent maxillary dentition. Osteopenia. No acute osseous abnormality identified. Visualized sinuses: Clear throughout. Soft tissues: Motion artifact at the oropharynx. Otherwise negative visible deep soft tissue spaces of the face. The major vascular structures at the skull base are enhancing and appear to be patent. The cavernous sinus appears patent and enhancing. Bilateral ICA siphon  calcified atherosclerosis. No upper cervical lymphadenopathy. Limited intracranial: Stable and negative. IMPRESSION: 1. Preseptal right periorbital soft tissue thickening and swelling compatible with cellulitis. No postseptal involvement or complicating features. 2. Otherwise negative. Electronically Signed   By: Genevie Ann M.D.   On: 09/10/2019 19:16    ____________________________________________   PROCEDURES  Procedure(s) performed (including Critical Care):  Procedures   ____________________________________________   INITIAL IMPRESSION / ASSESSMENT AND PLAN / ED COURSE  Valerie Cochran was evaluated in Emergency Department on 09/10/2019 for the symptoms described in the history of present illness. She was evaluated in the context of the global COVID-19 pandemic, which necessitated consideration that the patient might be at risk for infection with the SARS-CoV-2 virus that causes COVID-19. Institutional protocols and algorithms that pertain to the evaluation of patients at risk for COVID-19 are in a state of rapid change based on information released by regulatory bodies including the CDC and federal and state organizations. These policies and algorithms were followed during the patient's care in the ED.      We will treat the patient with Augmentin and doxycycline give her a little bit of medicine for pain if she needs it.  She will return tomorrow for recheck sooner if it gets worse at all including worsening pain swelling or fever.       ____________________________________________   FINAL CLINICAL IMPRESSION(S) / ED DIAGNOSES  Final diagnoses:  Periorbital cellulitis of right eye     ED Discharge Orders         Ordered    amoxicillin-clavulanate (AUGMENTIN) 875-125 MG tablet  2 times daily     09/10/19 2149    doxycycline (MONODOX) 100 MG capsule  2 times daily     09/10/19 2149    HYDROcodone-acetaminophen (NORCO/VICODIN) 5-325 MG tablet  Every 6 hours PRN     09/10/19  2152           Note:  This document was prepared using Dragon voice recognition software and may include unintentional dictation errors.    Nena Polio, MD 09/10/19 2210

## 2019-09-10 NOTE — ED Provider Notes (Signed)
Patient not currently in the room   Nena Polio, MD 09/10/19 2133

## 2019-09-10 NOTE — Discharge Instructions (Signed)
Please take the Augmentin 1 pill twice a day with food.  Take the doxycycline 1 pill twice a day also.  These are antibiotics and should help with the infection.  Take the second Percocet if you wake up in the morning with bad pain in the eye.  If the pain is about the same as it is now take the pain pill, if the pain is much worse than it is now please return.  Please return tomorrow for recheck .  I would come in the afternoon.  If the eye is much worse; for example swollen shut or much redder come back in the morning.  I will also give you some Vicodin.  1 pill 4 times a day as needed for pain.  Please be careful.  The Vicodin and the Percocet can both make you groggy and woozy.  Do not fall down.  Do not drive on them.  It can also make you constipated.  Once the Vicodin is gone you can use regular Tylenol for pain.

## 2019-11-14 DIAGNOSIS — K219 Gastro-esophageal reflux disease without esophagitis: Secondary | ICD-10-CM | POA: Insufficient documentation

## 2020-01-03 ENCOUNTER — Ambulatory Visit: Payer: Medicare Other | Attending: Internal Medicine

## 2020-01-03 DIAGNOSIS — Z23 Encounter for immunization: Secondary | ICD-10-CM

## 2020-01-03 NOTE — Progress Notes (Signed)
   Covid-19 Vaccination Clinic  Name:  Valerie Cochran    MRN: [id number] DOB: 1933/05/19  01/03/2020  [name] was observed post Covid-19 immunization for 15 minutes without incident. She was provided with Vaccine Information Sheet and instruction to access the V-Safe system.   [name] was instructed to call 911 with any severe reactions post vaccine: Marland Kitchen Difficulty breathing  . Swelling of face and throat  . A fast heartbeat  . A bad rash all over body  . Dizziness and weakness   Immunizations Administered    Name Date Dose VIS Date Route   Pfizer COVID-19 Vaccine 01/03/2020  3:21 PM 0.3 mL 09/19/2019 Intramuscular   Manufacturer: [place]   Lot: [id number]   [place]: [id number]

## 2020-01-27 ENCOUNTER — Ambulatory Visit: Payer: Medicare Other | Attending: Internal Medicine

## 2020-01-27 DIAGNOSIS — Z23 Encounter for immunization: Secondary | ICD-10-CM

## 2020-01-27 NOTE — Progress Notes (Signed)
   Covid-19 Vaccination Clinic  Name:  Valerie Cochran    MRN: [id number] DOB: [birth year]  01/27/2020  [name] was observed post Covid-19 immunization for 30 minutes based on pre-vaccination screening without incident. She was provided with Vaccine Information Sheet and instruction to access the V-Safe system.   [name] was instructed to call 911 with any severe reactions post vaccine: Marland Kitchen Difficulty breathing  . Swelling of face and throat  . A fast heartbeat  . A bad rash all over body  . Dizziness and weakness   Immunizations Administered    Name Date Dose VIS Date Route   Pfizer COVID-19 Vaccine 01/27/2020  3:37 PM 0.3 mL 12/03/2018 Intramuscular   Manufacturer: [place]   Lot: [id number]   [place]: [id number]

## 2021-10-09 ENCOUNTER — Encounter: Payer: Self-pay | Admitting: Intensive Care

## 2021-10-09 ENCOUNTER — Emergency Department: Payer: Medicare Other

## 2021-10-09 ENCOUNTER — Other Ambulatory Visit: Payer: Self-pay

## 2021-10-09 DIAGNOSIS — Z20822 Contact with and (suspected) exposure to covid-19: Secondary | ICD-10-CM | POA: Diagnosis not present

## 2021-10-09 DIAGNOSIS — E119 Type 2 diabetes mellitus without complications: Secondary | ICD-10-CM | POA: Insufficient documentation

## 2021-10-09 DIAGNOSIS — K529 Noninfective gastroenteritis and colitis, unspecified: Secondary | ICD-10-CM | POA: Insufficient documentation

## 2021-10-09 DIAGNOSIS — R1030 Lower abdominal pain, unspecified: Secondary | ICD-10-CM | POA: Diagnosis present

## 2021-10-09 DIAGNOSIS — N200 Calculus of kidney: Secondary | ICD-10-CM | POA: Insufficient documentation

## 2021-10-09 DIAGNOSIS — I10 Essential (primary) hypertension: Secondary | ICD-10-CM | POA: Diagnosis not present

## 2021-10-09 LAB — CBC WITH DIFFERENTIAL/PLATELET
Abs Immature Granulocytes: 0.02 10*3/uL (ref 0.00–0.07)
Basophils Absolute: 0 10*3/uL (ref 0.0–0.1)
Basophils Relative: 1 %
Eosinophils Absolute: 0 10*3/uL (ref 0.0–0.5)
Eosinophils Relative: 0 %
HCT: 42.1 % (ref 36.0–46.0)
Hemoglobin: 13.5 g/dL (ref 12.0–15.0)
Immature Granulocytes: 0 %
Lymphocytes Relative: 38 %
Lymphs Abs: 1.9 10*3/uL (ref 0.7–4.0)
MCH: 30.5 pg (ref 26.0–34.0)
MCHC: 32.1 g/dL (ref 30.0–36.0)
MCV: 95 fL (ref 80.0–100.0)
Monocytes Absolute: 0.3 10*3/uL (ref 0.1–1.0)
Monocytes Relative: 6 %
Neutro Abs: 2.8 10*3/uL (ref 1.7–7.7)
Neutrophils Relative %: 55 %
Platelets: 298 10*3/uL (ref 150–400)
RBC: 4.43 MIL/uL (ref 3.87–5.11)
RDW: 13.4 % (ref 11.5–15.5)
WBC: 5.1 10*3/uL (ref 4.0–10.5)
nRBC: 0 % (ref 0.0–0.2)

## 2021-10-09 LAB — CBG MONITORING, ED: Glucose-Capillary: 85 mg/dL (ref 70–99)

## 2021-10-09 LAB — COMPREHENSIVE METABOLIC PANEL
ALT: 10 U/L (ref 0–44)
AST: 18 U/L (ref 15–41)
Albumin: 4 g/dL (ref 3.5–5.0)
Alkaline Phosphatase: 71 U/L (ref 38–126)
Anion gap: 10 (ref 5–15)
BUN: 20 mg/dL (ref 8–23)
CO2: 25 mmol/L (ref 22–32)
Calcium: 10.1 mg/dL (ref 8.9–10.3)
Chloride: 105 mmol/L (ref 98–111)
Creatinine, Ser: 0.94 mg/dL (ref 0.44–1.00)
GFR, Estimated: 59 mL/min — ABNORMAL LOW (ref >60)
Glucose, Bld: 109 mg/dL — ABNORMAL HIGH (ref 70–99)
Potassium: 4.6 mmol/L (ref 3.5–5.1)
Sodium: 140 mmol/L (ref 135–145)
Total Bilirubin: 1.1 mg/dL (ref 0.3–1.2)
Total Protein: 7.2 g/dL (ref 6.5–8.1)

## 2021-10-09 LAB — RESP PANEL BY RT-PCR (FLU A&B, COVID) ARPGX2
Influenza A by PCR: NEGATIVE
Influenza B by PCR: NEGATIVE
SARS Coronavirus 2 by RT PCR: NEGATIVE

## 2021-10-09 LAB — LIPASE, BLOOD: Lipase: 35 U/L (ref 11–51)

## 2021-10-09 MED ORDER — IOHEXOL 300 MG/ML  SOLN
80.0000 mL | Freq: Once | INTRAMUSCULAR | Status: AC | PRN
  Administered 2021-10-09: 80 mL via INTRAVENOUS

## 2021-10-09 NOTE — ED Provider Notes (Signed)
°  Emergency Medicine Provider Triage Evaluation Note  Valerie Cochran , a 88 y.o.female,  was evaluated in triage.  Pt complains of lower left abdominal pain, vomiting, and diarrhea for the past week.  Additionally endorses headache and feeling feverish.  Denies chest pain, shortness of breath, urinary symptoms, back pain, or weakness   Review of Systems  Positive: Abdominal pain, vomiting, diarrhea Negative: Denies fever, chest pain, shortness of breath  Physical Exam  There were no vitals filed for this visit. Gen:   Awake, no distress   Resp:  Normal effort  MSK:   Moves extremities without difficulty  Other:  Tenderness in the lower left and lower right abdomen.  Medical Decision Making  Given the patient's initial medical screening exam, the following diagnostic evaluation has been ordered. The patient will be placed in the appropriate treatment space, once one is available, to complete the evaluation and treatment. I have discussed the plan of care with the patient and I have advised the patient that an ED physician or mid-level practitioner will reevaluate their condition after the test results have been received, as the results may give them additional insight into the type of treatment they may need.    Diagnostics: Labs, UA, abdominal CT  Treatments: none immediately   Teodoro Spray, PA 10/09/21 1618    Lucrezia Starch, MD 10/09/21 1744

## 2021-10-09 NOTE — ED Triage Notes (Addendum)
C/o abdominal pain with N/V/D X1 week. Reports decreased appetite and headache

## 2021-10-10 ENCOUNTER — Emergency Department
Admission: EM | Admit: 2021-10-10 | Discharge: 2021-10-10 | Disposition: A | Discharge status: Home / Self Care | Payer: Medicare Other | Attending: Emergency Medicine | Admitting: Emergency Medicine

## 2021-10-10 DIAGNOSIS — K529 Noninfective gastroenteritis and colitis, unspecified: Secondary | ICD-10-CM

## 2021-10-10 DIAGNOSIS — N200 Calculus of kidney: Secondary | ICD-10-CM

## 2021-10-10 DIAGNOSIS — R197 Diarrhea, unspecified: Secondary | ICD-10-CM

## 2021-10-10 DIAGNOSIS — R112 Nausea with vomiting, unspecified: Secondary | ICD-10-CM

## 2021-10-10 LAB — URINALYSIS, COMPLETE (UACMP) WITH MICROSCOPIC
Bacteria, UA: NONE SEEN
Bilirubin Urine: NEGATIVE
Bilirubin Urine: NEGATIVE
Glucose, UA: NEGATIVE mg/dL
Glucose, UA: NEGATIVE mg/dL
Hgb urine dipstick: NEGATIVE
Hgb urine dipstick: NEGATIVE
Ketones, ur: 5 mg/dL — AB
Ketones, ur: 5 mg/dL — AB
Nitrite: NEGATIVE
Nitrite: NEGATIVE
Protein, ur: NEGATIVE mg/dL
Protein, ur: NEGATIVE mg/dL
Specific Gravity, Urine: >1.046 — ABNORMAL HIGH (ref 1.005–1.030)
Specific Gravity, Urine: 1.021 (ref 1.005–1.030)
Squamous Epithelial / HPF: NONE SEEN (ref 0–5)
WBC, UA: >50 WBC/hpf — ABNORMAL HIGH (ref 0–5)
pH: 5 (ref 5.0–8.0)
pH: 6 (ref 5.0–8.0)

## 2021-10-10 LAB — URINALYSIS, ROUTINE W REFLEX MICROSCOPIC
Bacteria, UA: NONE SEEN
Bilirubin Urine: NEGATIVE
Glucose, UA: NEGATIVE mg/dL
Hgb urine dipstick: NEGATIVE
Ketones, ur: 5 mg/dL — AB
Nitrite: NEGATIVE
Protein, ur: 30 mg/dL — AB
Specific Gravity, Urine: >1.046 — ABNORMAL HIGH (ref 1.005–1.030)
WBC, UA: >50 WBC/hpf — ABNORMAL HIGH (ref 0–5)
pH: 5 (ref 5.0–8.0)

## 2021-10-10 LAB — CBG MONITORING, ED: Glucose-Capillary: 113 mg/dL — ABNORMAL HIGH (ref 70–99)

## 2021-10-10 MED ORDER — FENTANYL CITRATE PF 50 MCG/ML IJ SOSY
50.0000 ug | PREFILLED_SYRINGE | Freq: Once | INTRAMUSCULAR | Status: AC
  Administered 2021-10-10: 50 ug via INTRAVENOUS
  Filled 2021-10-10: qty 1

## 2021-10-10 MED ORDER — ONDANSETRON 4 MG PO TBDP: 4.0000 mg | ORAL_TABLET | Freq: Three times a day (TID) | ORAL | 0 refills | Status: DC | PRN

## 2021-10-10 MED ORDER — LACTATED RINGERS IV BOLUS
1000.0000 mL | Freq: Once | INTRAVENOUS | Status: AC
Start: 2021-10-10 — End: 2021-10-10
  Administered 2021-10-10: 1000 mL via INTRAVENOUS

## 2021-10-10 MED ORDER — TRAMADOL HCL 50 MG PO TABS: 50.0000 mg | ORAL_TABLET | Freq: Four times a day (QID) | ORAL | 0 refills | Status: AC | PRN

## 2021-10-10 MED ORDER — KETOROLAC TROMETHAMINE 30 MG/ML IJ SOLN
15.0000 mg | Freq: Once | INTRAMUSCULAR | Status: AC
  Administered 2021-10-10: 15 mg via INTRAVENOUS
  Filled 2021-10-10: qty 1

## 2021-10-10 MED ORDER — ACETAMINOPHEN 500 MG PO TABS: 1000.0000 mg | ORAL_TABLET | Freq: Three times a day (TID) | ORAL | 0 refills | Status: AC | PRN

## 2021-10-10 MED ORDER — ONDANSETRON HCL 4 MG/2ML IJ SOLN
4.0000 mg | Freq: Once | INTRAMUSCULAR | Status: AC
  Administered 2021-10-10: 4 mg via INTRAVENOUS
  Filled 2021-10-10: qty 2

## 2021-10-10 NOTE — Discharge Instructions (Signed)
Pain control: Take tylenol 1000mg  every 8 hours. Take 50mg  of tramadol every 6 hours for breakthrough pain. If you need the tramadol make sure to take one senokot as well to prevent constipation.  Do not drink alcohol, drive or participate in any other potentially dangerous activities while taking this medication as it may make you sleepy. Do not take this medication with any other sedating medications, either prescription or over-the-counter.  Follow-up with urology for that big stone that we found in your kidney.  Return to the emergency room if you have flank pain, worsening abdominal pain, fever.  Otherwise follow-up with your PCP as well.

## 2021-10-10 NOTE — ED Notes (Signed)
Pt ambulated to toilet with one assist, tolerated well.

## 2021-10-10 NOTE — ED Notes (Signed)
Pt denies nausea at this time.

## 2021-10-10 NOTE — ED Provider Notes (Signed)
[facility] Provider Note    Event Date/Time   First MD Initiated Contact with Patient 10/10/21 0101     (approximate)   History   Abdominal Pain   HPI  Valerie Cochran is a 88 y.o. female with a history of diabetes and hypertension who presents for evaluation of abdominal pain.  Patient is complaining of 1 week of sharp/ cramping intermittent lower abdominal pain associated with nausea, a few episodes of nonbloody nonbilious emesis and watery diarrhea.  Also reports a headache and thinks that she may have a fever at home.  She denies dysuria or hematuria, flank pain, chest pain, shortness of breath.  She currently reports that her pain is 10 out of 10.     Physical Exam   Triage Vital Signs: ED Triage Vitals [10/09/21 1618]  Enc Vitals Group     BP (!) 176/97     Pulse Rate 95     Resp 18     Temp 98.8 F (37.1 C)     Temp Source Oral     SpO2 100 %     Weight 140 lb (63.5 kg)     Height      Head Circumference      Peak Flow      Pain Score 10     Pain Loc      Pain Edu?      Excl. in [place]?     Most recent vital signs: Vitals:   10/10/21 0315 10/10/21 0550  BP: (!) 163/82 140/85  Pulse: 73 78  Resp: 16 14  Temp:    SpO2: 100% 100%     General: Awake, no distress.  CV:  RRR, no murmurs, strong pulses x 4 Resp:  Normal effort. Lungs clear to auscultation Abd:  Soft, very mild suprapubic tenderness, non distended, positive bowel sounds, no rebound or guarding.  No CVA tenderness MSK:  No edema or cyanosis Neuro:  Normal speech, face is symmetric, moving all extremities with no gross focal neuro deficit   ED Results / Procedures / Treatments   Labs (all labs ordered are listed, but only abnormal results are displayed) Labs Reviewed  COMPREHENSIVE METABOLIC PANEL - Abnormal; Notable for the following components:      Result Value   Glucose, Bld 109 (*)    GFR, Estimated 59 (*)    All other components within normal limits   URINALYSIS, ROUTINE W REFLEX MICROSCOPIC - Abnormal; Notable for the following components:   Color, Urine YELLOW (*)    APPearance HAZY (*)    Specific Gravity, Urine >1.046 (*)    Ketones, ur 5 (*)    Protein, ur 30 (*)    Leukocytes,Ua LARGE (*)    WBC, UA >50 (*)    All other components within normal limits  URINALYSIS, COMPLETE (UACMP) WITH MICROSCOPIC - Abnormal; Notable for the following components:   Color, Urine YELLOW (*)    APPearance CLOUDY (*)    Specific Gravity, Urine >1.046 (*)    Ketones, ur 5 (*)    Leukocytes,Ua LARGE (*)    WBC, UA >50 (*)    Bacteria, UA RARE (*)    All other components within normal limits  URINALYSIS, COMPLETE (UACMP) WITH MICROSCOPIC - Abnormal; Notable for the following components:   Color, Urine STRAW (*)    APPearance HAZY (*)    Ketones, ur 5 (*)    Leukocytes,Ua SMALL (*)    All other components within normal  limits  CBG MONITORING, ED - Abnormal; Notable for the following components:   Glucose-Capillary 113 (*)    All other components within normal limits  RESP PANEL BY RT-PCR (FLU A&B, COVID) [id number]  URINE CULTURE  LIPASE, BLOOD  CBC WITH DIFFERENTIAL/PLATELET  CBG MONITORING, ED     EKG  none   RADIOLOGY I have personally reviewed the images performed during this visit and I agree with the Radiologist's read.   Interpretation by Radiologist:  CT Abdomen Pelvis W Contrast  Result Date: 10/09/2021 CLINICAL DATA:  LLQ abdominal pain EXAM: CT ABDOMEN AND PELVIS WITH CONTRAST TECHNIQUE: Multidetector CT imaging of the abdomen and pelvis was performed using the standard protocol following bolus administration of intravenous contrast. CONTRAST:  21mL OMNIPAQUE IOHEXOL 300 MG/ML  SOLN COMPARISON:  None. FINDINGS: Lower chest: At least 3 vessel coronary artery calcifications. Hepatobiliary: Vague hypodensity within the right hepatic lobe of unclear etiology (6:20). No focal liver abnormality. No gallstones, gallbladder wall  thickening, or pericholecystic fluid. No biliary dilatation. Pancreas: No focal lesion. Normal pancreatic contour. No surrounding inflammatory changes. No main pancreatic ductal dilatation. Spleen: Normal in size without focal abnormality. Adrenals/Urinary Tract: Coarsely calcified 3.9 x 2.4 cm right adrenal gland nodule. Bilateral kidneys enhance symmetrically. There is a triangular 1.3 cm left inferior renal pole calcified stone. Associated focal calyceal dilatation (4:58). Subcentimeter hypodensities are too small to characterize. No right hydroureteronephrosis. No left hydroureter. No right nephrolithiasis. No ureterolithiasis bilaterally. The urinary bladder is unremarkable. On delayed imaging, there is no urothelial wall thickening and there are no filling defects in the opacified portions of the bilateral collecting systems or ureters. Stomach/Bowel: Stomach is within normal limits. No evidence of bowel wall thickening or dilatation. The appendix is not definitely identified with no inflammatory changes in the right lower quadrant to suggest acute appendicitis. Vascular/Lymphatic: No abdominal aorta or iliac aneurysm. At least moderate atherosclerotic plaque of the aorta and its branches. No abdominal, pelvic, or inguinal lymphadenopathy. Reproductive: Uterus and bilateral adnexa are unremarkable. Other: No intraperitoneal free fluid. No intraperitoneal free gas. No organized fluid collection. Musculoskeletal: No abdominal wall hernia or abnormality. Diffusely decreased bone density. Chronic appearing vertebral body height loss of the L4 and L5 levels. Multilevel degenerative changes of the spine. No acute displaced fracture. Densely sclerotic lesion within the L1 vertebral body likely a bone island. No suspicious lytic or blastic osseous lesions. IMPRESSION: 1. Focally obstructive left 1.3 cm nephrolithiasis (inferior pole left calyceal dilatation). 2. Incidentally noted Coarsely calcified 3.9 x 2.4 cm right  adrenal gland nodule. 3.  Aortic Atherosclerosis (ICD10-I70.0). Electronically Signed   By: Iven Finn M.D.   On: 10/09/2021 18:09      PROCEDURES:  Critical Care performed: No  Procedures   MEDICATIONS ORDERED IN ED: Medications  iohexol (OMNIPAQUE) 300 MG/ML solution 80 mL (80 mLs Intravenous Contrast Given 10/09/21 1721)  ketorolac (TORADOL) 30 MG/ML injection 15 mg (15 mg Intravenous Given 10/10/21 0131)  fentaNYL (SUBLIMAZE) injection 50 mcg (50 mcg Intravenous Given 10/10/21 0133)  ondansetron (ZOFRAN) injection 4 mg (4 mg Intravenous Given 10/10/21 0129)  lactated ringers bolus 1,000 mL (0 mLs Intravenous Stopped 10/10/21 0346)     IMPRESSION / MDM / ASSESSMENT AND PLAN / ED COURSE  I reviewed the triage vital signs and the nursing notes.  88 y.o. female with a history of diabetes and hypertension who presents for evaluation of 1 week of suprapubic sharp intermittent abdominal pain, nausea, nonbloody nonbilious emesis, diarrhea, headache, and subjective fevers.  On my examination patient is extremely well appearing and in no distress with normal vital signs.  Abdomen is soft with very minimal suprapubic tenderness but no rebound or guarding.  Differential diagnosis including viral syndrome such as COVID or influenza versus UTI versus pyelonephritis versus kidney stone versus diverticulitis versus gastroenteritis.  CT abdomen pelvis visualized by me showing a 1.3 cm nephrolithiasis in the inferior pole of the left kidney with focal obstruction but no signs of hydroureteronephrosis.  COVID and flu negative.  Labs showing no leukocytosis, no electrolyte derangements, no signs of severe dehydration, normal LFTs and lipase, normal glucose.  Initial urinalysis looking dirty therefore we will repeat a catheterized sample.  At this point it does not seem like patient's pain is coming from the kidney stone considering she has no flank pain and there is no signs of obstruction with dilation of  kidney or ureter on imaging.  If UA is positive that is most likely the cause of her symptoms.  If negative then I have no other etiology for her pain based on her CT scan therefore could be from the kidney stone.  Patient received a dose of IV Toradol and fentanyl for pain, fluids, and Zofran.  Plan to reassess after urine is back.  History is gathered from patient and her daughter who was at bedside.  Patient's EMR was reviewed including her last visit with her PCP in October 2022.  _________________________ 6:42 AM on 10/10/2021 ----------------------------------------- Pain is well controlled, no further episodes of vomiting and diarrhea in the emergency room.  Catheterized UA negative for UTI.  At this time patient's pain is either due to a viral gastroenteritis or the renal stone.  I did refill send a referral to urology for more urgent evaluation.  Patient be discharged home with Tylenol and tramadol for pain, Zofran for nausea and vomiting.  Discussed increase oral hydration and follow-up with PCP.  Recommended return to the emergency room for worsening abdominal pain, flank pain, fever or chills, or dysuria.        FINAL CLINICAL IMPRESSION(S) / ED DIAGNOSES   Final diagnoses:  Nausea vomiting and diarrhea  Gastroenteritis  Left renal stone     Rx / DC Orders   ED Discharge Orders          Ordered    traMADol (ULTRAM) 50 MG tablet  Every 6 hours PRN        10/10/21 0640    acetaminophen (TYLENOL) 500 MG tablet  Every 8 hours PRN        10/10/21 0640    ondansetron (ZOFRAN-ODT) 4 MG disintegrating tablet  Every 8 hours PRN        10/10/21 0640    Ambulatory referral to Urology       Comments: 1.3cm partially obstruction renal stone with pain   10/10/21 0641             Note:  This document was prepared using Dragon voice recognition software and may include unintentional dictation errors.    Alfred Levins, [place], MD 10/10/21 [phone]

## 2021-10-12 LAB — URINE CULTURE: Culture: >=100000 — AB

## 2021-10-12 NOTE — Progress Notes (Signed)
10/13/21 10:11 AM   Linnell Fulling 30-May-1933 [id number]  Referring provider:  Marinda Elk, MD [facility] [place],  [place] [address] Chief Complaint  Patient presents with   Nephrolithiasis     HPI: Valerie Cochran is a 88 y.o.female who presents today for further evaluation of kidney stones.   She was recently seen in the ED on 10/10/2021 for evaluation of abdominal pain, nausea vomiting and diarrhea.  Initial voided urinalysis appeared contaminated with squamous epithelial cells, greater than 50 WBCs.  Apparently this was repeated via catheterized specimen which appeared last impressive with only 21-50 WBCs, no bacteria and otherwise unremarkable. Urine culture grew Citrobacter koseri.  It appears that this was sent from the catheterized specimen but it is difficult to ascertain.  CT abdomen and pelvis visualized a focally obstructive left 1.3 cm nephrolithiasis (inferior pole left calyceal dilatation). An incidentally noted Coarsely calcified 3.9 x 2.4 cm right adrenal gland nodule.She was discharged on tramadol.  In the absence of urinary symptoms, her urine was not treated and urinalysis was attributed to her nonobstructing stone.  She was advised to follow-up with urology.  She is accompanied by her daughter. She reports that she is feeling better today. She originally thought her symptoms were due to a stomach bug. She has no history of kidney stones until now. She has left flank pain which is nonspecific/chronic and not severe. She has a history of urinary tract infections. She reports that she has not had an appetite and has lost over 30lbs in a couple months. She has spoken with her PCP who believes it is due to her Trulicity.   PMH: Past Medical History:  Diagnosis Date   Diabetes mellitus without complication ([place])    Hypertension     Surgical History: Past Surgical History:  Procedure Laterality Date   ARTERY BIOPSY Right 07/23/2019    Procedure: BIOPSY TEMPORAL ARTERY;  Surgeon: Benjamine Sprague, DO;  Location: [facility] ORS;  Service: General;  Laterality: Right;   SMALL INTESTINE SURGERY      Home Medications:  Allergies as of 10/13/2021       Reactions   Aspirin         Medication List        Accurate as of October 13, 2021 10:11 AM. If you have any questions, ask your nurse or doctor.          STOP taking these medications    doxycycline 100 MG capsule Commonly known as: MONODOX Stopped by: Hollice Espy, MD       TAKE these medications    acetaminophen 500 MG tablet Commonly known as: TYLENOL Take 2 tablets (1,000 mg total) by mouth every 8 (eight) hours as needed for mild pain, moderate pain, fever or headache.   clopidogrel 75 MG tablet Commonly known as: PLAVIX Take 75 mg by mouth daily.   cyclobenzaprine 10 MG tablet Commonly known as: FLEXERIL Take 10 mg by mouth 3 (three) times daily as needed for muscle spasms.   DULoxetine 30 MG capsule Commonly known as: CYMBALTA Take 30 mg by mouth daily.   fluticasone 50 MCG/ACT nasal spray Commonly known as: FLONASE Place 2 sprays into the nose daily.   gabapentin 300 MG capsule Commonly known as: NEURONTIN Take 300 mg by mouth at bedtime.   losartan 100 MG tablet Commonly known as: COZAAR Take 100 mg by mouth daily.   metFORMIN 500 MG 24 hr tablet Commonly known as: GLUCOPHAGE-XR Take  1,000 mg by mouth every evening.   mirtazapine 7.5 MG tablet Commonly known as: REMERON Take 7.5 mg by mouth every evening.   omeprazole 20 MG capsule Commonly known as: PRILOSEC Take 20 mg by mouth 2 (two) times daily.   ondansetron 4 MG disintegrating tablet Commonly known as: ZOFRAN-ODT Take 1 tablet (4 mg total) by mouth every 8 (eight) hours as needed for nausea or vomiting.   potassium chloride SA 20 MEQ tablet Commonly known as: KLOR-CON M Take 20 mEq by mouth daily as needed.   rosuvastatin 5 MG tablet Commonly known as: CRESTOR Take 5  mg by mouth daily.   traMADol 50 MG tablet Commonly known as: Ultram Take 1 tablet (50 mg total) by mouth every 6 (six) hours as needed.   traZODone 50 MG tablet Commonly known as: DESYREL Take 50 mg by mouth at bedtime as needed for sleep.   triamcinolone cream 0.5 % Commonly known as: KENALOG Apply 1 application topically 2 (two) times daily.   Trulicity 3 HA/1.9FX Sopn Generic drug: Dulaglutide INJECT 0.5 MLS (3 MG TOTAL) SUBCUTANEOUSLY ONCE A WEEK What changed: Another medication with the same name was removed. Continue taking this medication, and follow the directions you see here. Changed by: Hollice Espy, MD        Allergies:  Allergies  Allergen Reactions   Aspirin     Family History: Family History  Problem Relation Age of Onset   Breast cancer Neg Hx     Social History:  reports that she has never smoked. She has never used smokeless tobacco. She reports that she does not drink alcohol and does not use drugs.   Physical Exam: BP (!) 154/89    Pulse 81    Ht 5\' 4"  (1.626 m)    Wt 140 lb (63.5 kg)    BMI 24.03 kg/m   Constitutional:  Alert and oriented, No acute distress. HEENT: [place] AT, moist mucus membranes.  Trachea midline, no masses. Cardiovascular: No clubbing, cyanosis, or edema. Respiratory: Normal respiratory effort, no increased work of breathing. Skin: No rashes, bruises or suspicious lesions. Neurologic: Grossly intact, no focal deficits, moving all 4 extremities. Psychiatric: Normal mood and affect.  Laboratory Data:  Lab Results  Component Value Date   CREATININE 0.94 10/09/2021    Urinalysis Similar to previous urinalysis   Pertinent Imaging: CLINICAL DATA:  LLQ abdominal pain   EXAM: CT ABDOMEN AND PELVIS WITH CONTRAST   TECHNIQUE: Multidetector CT imaging of the abdomen and pelvis was performed using the standard protocol following bolus administration of intravenous contrast.   CONTRAST:  49mL OMNIPAQUE IOHEXOL 300 MG/ML   SOLN   COMPARISON:  None.   FINDINGS: Lower chest: At least 3 vessel coronary artery calcifications.   Hepatobiliary: Vague hypodensity within the right hepatic lobe of unclear etiology (6:20). No focal liver abnormality. No gallstones, gallbladder wall thickening, or pericholecystic fluid. No biliary dilatation.   Pancreas: No focal lesion. Normal pancreatic contour. No surrounding inflammatory changes. No main pancreatic ductal dilatation.   Spleen: Normal in size without focal abnormality.   Adrenals/Urinary Tract:   Coarsely calcified 3.9 x 2.4 cm right adrenal gland nodule.   Bilateral kidneys enhance symmetrically. There is a triangular 1.3 cm left inferior renal pole calcified stone. Associated focal calyceal dilatation (4:58). Subcentimeter hypodensities are too small to characterize.   No right hydroureteronephrosis. No left hydroureter. No right nephrolithiasis. No ureterolithiasis bilaterally.   The urinary bladder is unremarkable.   On delayed imaging, there  is no urothelial wall thickening and there are no filling defects in the opacified portions of the bilateral collecting systems or ureters.   Stomach/Bowel: Stomach is within normal limits. No evidence of bowel wall thickening or dilatation. The appendix is not definitely identified with no inflammatory changes in the right lower quadrant to suggest acute appendicitis.   Vascular/Lymphatic: No abdominal aorta or iliac aneurysm. At least moderate atherosclerotic plaque of the aorta and its branches. No abdominal, pelvic, or inguinal lymphadenopathy.   Reproductive: Uterus and bilateral adnexa are unremarkable.   Other: No intraperitoneal free fluid. No intraperitoneal free gas. No organized fluid collection.   Musculoskeletal:   No abdominal wall hernia or abnormality.   Diffusely decreased bone density. Chronic appearing vertebral body height loss of the L4 and L5 levels. Multilevel degenerative  changes of the spine. No acute displaced fracture. Densely sclerotic lesion within the L1 vertebral body likely a bone island. No suspicious lytic or blastic osseous lesions.   IMPRESSION: 1. Focally obstructive left 1.3 cm nephrolithiasis (inferior pole left calyceal dilatation). 2. Incidentally noted Coarsely calcified 3.9 x 2.4 cm right adrenal gland nodule. 3.  Aortic Atherosclerosis (ICD10-I70.0).     Electronically Signed   By: Iven Finn M.D.   On: 10/09/2021 18:09  CT was personally reviewed today, agree with radiologic interpretation.  Essentially nonobstructing lower pole stone with calyceal dilation, possibly lower pole obstruction but appears to be chronic.  Assessment & Plan:    Left kidney stone  - Likely chronic and incidental finding, would not recommend any further intervention at this point -do not suspect that this was an the cause of her presenting symptoms, abdominal pain nausea vomiting have resolved although does have some dull nonspecific left flank pain that does not seem bothersome -Would not recommend intervention for the stone given her age and comorbidities unless she is having recurrent infection, severe flank pain, or any GU specific symptoms  2. Possible acute cystitis  - Pansensitive  - Urine grew Citrobacter koseri in emergency room  - Urinalysis today shows consistent with urinalysis taken in the ED, small amount of leukocytes otherwise unremarkable - Urine sent for culture; will decide if she needs antibiotics later.  -She has no lower urinary tract symptoms including no dysuria, urgency frequency confusion, etc.  Only suspicious symptom is p.o. intolerance and weight loss which is chronic.  May or may not treat based on repeat culture.  Both she and her daughter are agreeable with this plan.  3. Right adrenal gland nodule  - No previous imaging for comparison, likely benign although in setting of weight loss, following the small area does  seem reasonable and we can also survey her stone concomitantly - Due to age would recommend a repeat CT in 6 months, if it is stable would not recommend any further intervention  - CT abdomen w/w/o contrast; scheduled   Return in 6 months with CT abdomen prior or sooner as needed  I,Kailey Littlejohn,acting as a scribe for Hollice Espy, MD.,have documented all relevant documentation on the behalf of Hollice Espy, MD,as directed by  Hollice Espy, MD while in the presence of Hollice Espy, MD.  I have reviewed the above documentation for accuracy and completeness, and I agree with the above.   Hollice Espy, MD   [facility] [address], [place], [place] [address] [phone]

## 2021-10-13 ENCOUNTER — Ambulatory Visit (INDEPENDENT_AMBULATORY_CARE_PROVIDER_SITE_OTHER): Payer: Medicare Other | Admitting: Urology

## 2021-10-13 ENCOUNTER — Other Ambulatory Visit: Payer: Self-pay

## 2021-10-13 VITALS — BP 154/89 | HR 81 | Ht 64.0 in | Wt 140.0 lb

## 2021-10-13 DIAGNOSIS — E278 Other specified disorders of adrenal gland: Secondary | ICD-10-CM

## 2021-10-13 DIAGNOSIS — N200 Calculus of kidney: Secondary | ICD-10-CM

## 2021-10-13 NOTE — Progress Notes (Signed)
Brief Pharmacy Note  Pharmacist reviewed ED culture report and discussed with EDP Vladimir Crofts. Urine culture with citrobacter, will give Keflex 500 mg TID x 5 days. Discussed with daughter. Patient went to Urologist today where they collected another culture. Prescription called in to [facility] per daughter's request. Also encouraged her to follow up with Urologist's recommendations for antibiotics.  Tawnya Crook, PharmD, BCPS Clinical Pharmacist 10/13/2021 4:22 PM

## 2021-10-14 LAB — MICROSCOPIC EXAMINATION

## 2021-10-14 LAB — URINALYSIS, COMPLETE
Bilirubin, UA: NEGATIVE
Glucose, UA: NEGATIVE
Nitrite, UA: NEGATIVE
Specific Gravity, UA: 1.02 (ref 1.005–1.030)
Urobilinogen, Ur: 1 mg/dL (ref 0.2–1.0)
pH, UA: 5.5 (ref 5.0–7.5)

## 2021-10-16 LAB — CULTURE, URINE COMPREHENSIVE

## 2021-10-17 ENCOUNTER — Telehealth: Payer: Self-pay | Admitting: *Deleted

## 2021-10-17 MED ORDER — SULFAMETHOXAZOLE-TRIMETHOPRIM 800-160 MG PO TABS: 1.0000 | ORAL_TABLET | Freq: Two times a day (BID) | ORAL | 0 refills | Status: DC

## 2021-10-17 NOTE — Telephone Encounter (Addendum)
Patient informed, she has been having increased nausea and urine frequency. Sent in Rx to [facility] as requested.   ----- Message from Hollice Espy, MD sent at 10/17/2021  9:10 AM EST ----- UCx grew same bug as in the ED.  In the office, she was not having any bladder symptoms.  We can treat or not treat depending on her preference (only symptom was poor appetite)  If she would like to be treated, recommend bactrim ds bid x 5 days.  Hollice Espy, MD

## 2021-12-29 ENCOUNTER — Ambulatory Visit (LOCAL_COMMUNITY_HEALTH_CENTER): Payer: Medicare Other

## 2021-12-29 ENCOUNTER — Other Ambulatory Visit: Payer: Self-pay

## 2021-12-29 DIAGNOSIS — Z23 Encounter for immunization: Secondary | ICD-10-CM

## 2021-12-29 DIAGNOSIS — Z719 Counseling, unspecified: Secondary | ICD-10-CM

## 2021-12-29 NOTE — Progress Notes (Signed)
?  Are you feeling sick today? No ? ? ?Have you ever received a dose of COVID-19 Vaccine? [employer], [place], [place], [place], Other) Yes ? ?If yes, which vaccine and how many doses?   Pfizer primary series 2 doses, Pfizer monovalent booster 1 dose, Moderna Monovalent Booster 1 dose. ? ? ?Did you bring the vaccination record card or other documentation?  Yes ? ? ?Do you have a health condition or are undergoing treatment that makes you moderately or severely immunocompromised? This would include, but not be limited to: cancer, HIV, organ transplant, immunosuppressive therapy/high-dose corticosteroids, or moderate/severe primary immunodeficiency.  No ? ?Have you received COVID-19 vaccine before or during hematopoietic cell transplant (HCT) or CAR-T-cell therapies? No ? ?Have you ever had an allergic reaction to: (This would include a severe allergic reaction or a reaction that caused hives, swelling, or respiratory distress, including wheezing.) A component of a COVID-19 vaccine or a previous dose of COVID-19 vaccine? No ? ? ?Have you ever had an allergic reaction to another vaccine (other thanCOVID-19 vaccine) or an injectable medication? (This would include a severe allergic reaction or a reaction that caused hives, swelling, or respiratory distress, including wheezing.)   No ?  ?Do you have a history of any of the following: ? ?Myocarditis or Pericarditis No ?Thrombosis with thrombocytopenia syndrome (TTS) No ?Multisystem Inflammatory Syndrome (MIS-C or MIS-A)? No ?Immune-mediate syndrome defined by thrombosis and thrombocytopenia, such as heparin--induced thrombocytopenia (HIT)  No ?Guillain-Barr? Syndrome (GBS) No ?COVID-19 disease within the past 3 months? No ?Vaccinated with monkeypox vaccine in the last 4 weeks? No ? ?NCIR and COVID card updated and given to patient. Ranae Palms, RN ? ?

## 2022-04-07 ENCOUNTER — Ambulatory Visit
Admission: RE | Admit: 2022-04-07 | Discharge: 2022-04-07 | Disposition: A | Discharge status: Home / Self Care | Payer: Medicare Other | Source: Ambulatory Visit | Attending: Urology | Admitting: Urology

## 2022-04-07 DIAGNOSIS — N200 Calculus of kidney: Secondary | ICD-10-CM | POA: Diagnosis present

## 2022-04-07 DIAGNOSIS — E278 Other specified disorders of adrenal gland: Secondary | ICD-10-CM | POA: Insufficient documentation

## 2022-04-07 DIAGNOSIS — E279 Disorder of adrenal gland, unspecified: Secondary | ICD-10-CM

## 2022-04-07 LAB — POCT I-STAT CREATININE: Creatinine, Ser: 0.9 mg/dL (ref 0.44–1.00)

## 2022-04-07 MED ORDER — IOHEXOL 300 MG/ML  SOLN
100.0000 mL | Freq: Once | INTRAMUSCULAR | Status: AC | PRN
  Administered 2022-04-07: 100 mL via INTRAVENOUS

## 2022-04-12 ENCOUNTER — Ambulatory Visit: Payer: Medicare Other | Admitting: Urology

## 2022-04-26 ENCOUNTER — Other Ambulatory Visit: Payer: Self-pay | Admitting: Urology

## 2022-04-26 ENCOUNTER — Encounter: Payer: Self-pay | Admitting: Urology

## 2022-04-26 ENCOUNTER — Ambulatory Visit: Payer: Medicare Other | Admitting: Urology

## 2022-04-26 ENCOUNTER — Ambulatory Visit (INDEPENDENT_AMBULATORY_CARE_PROVIDER_SITE_OTHER): Payer: Medicare Other | Admitting: Urology

## 2022-04-26 VITALS — BP 163/80 | HR 96 | Ht 66.0 in | Wt 141.0 lb

## 2022-04-26 DIAGNOSIS — D4112 Neoplasm of uncertain behavior of left renal pelvis: Secondary | ICD-10-CM

## 2022-04-26 DIAGNOSIS — N200 Calculus of kidney: Secondary | ICD-10-CM

## 2022-04-26 DIAGNOSIS — E278 Other specified disorders of adrenal gland: Secondary | ICD-10-CM

## 2022-04-26 LAB — MICROSCOPIC EXAMINATION: WBC, UA: >30 /hpf — AB (ref 0–5)

## 2022-04-26 LAB — URINALYSIS, COMPLETE
Bilirubin, UA: NEGATIVE
Glucose, UA: NEGATIVE
Ketones, UA: NEGATIVE
Nitrite, UA: NEGATIVE
Protein,UA: NEGATIVE
Specific Gravity, UA: <1.005 — ABNORMAL LOW (ref 1.005–1.030)
Urobilinogen, Ur: 0.2 mg/dL (ref 0.2–1.0)
pH, UA: 5.5 (ref 5.0–7.5)

## 2022-04-26 NOTE — Progress Notes (Signed)
Surgical Physician Order Form [facility] [place]  * Scheduling expectation :  Patient would like to wait until after a wedding in August, please discuss possible dates with her daughter  *Length of Case:   *Clearance needed: yes, [place] (cardiology)  *Anticoagulation Instructions: Hold all anticoagulants- plavix  *Aspirin Instructions: Ok to continue Aspirin  *Post-op visit Date/Instructions:   TBD  *Diagnosis:  Left nephrolithiasis, left renal pelvic lesion  *Procedure: left Ureteroscopy w/laser lithotripsy & stent placement (03009); possible left renal pelvic biopsy, possible laser fulguration of left renal pelvic lesion   Additional orders: N/A  -Admit type: OUTpatient  -Anesthesia: General  -VTE Prophylaxis Standing Order SCD's       Other:   -Standing Lab Orders Per Anesthesia    Lab other: UA&Urine Culture (the need to repeat if not within 30 days)  -Standing Test orders EKG/Chest x-ray per Anesthesia       Test other:   - Medications:  Ancef 2gm IV  -Other orders:  N/A

## 2022-04-26 NOTE — Patient Instructions (Signed)
Laser Therapy for Kidney Stones Laser therapy for kidney stones is a procedure to break up small, hard mineral deposits that form in the kidney (kidney stones). The procedure is done using a device that produces a focused beam of light (laser). The laser breaks up kidney stones into pieces that are small enough to be passed out of the body through urination or removed from the body during the procedure. You may need laser therapy if you have kidney stones that are painful or block your urinary tract. This procedure is done by inserting a tube (ureteroscope) into your kidney through the urethral opening. The urethra is the part of the body that drains urine from the bladder. In women, the urethra opens above the vaginal opening. In men, the urethra opens at the tip of the penis. The ureteroscope is inserted through the urethra, and surgical instruments are moved through the bladder and the muscular tube that connects the kidney to the bladder (ureter) until they reach the kidney. Tell a health care provider about: Any allergies you have. All medicines you are taking, including vitamins, herbs, eye drops, creams, and over-the-counter medicines. Any problems you or family members have had with anesthetic medicines. Any blood disorders you have. Any surgeries you have had. Any medical conditions you have. Whether you are pregnant or may be pregnant. What are the risks? Generally, this is a safe procedure. However, problems may occur, including: Infection. Bleeding. Allergic reactions to medicines. Damage to the urethra, bladder, or ureter. Urinary tract infection (UTI). Narrowing of the urethra (urethral stricture). Difficulty passing urine. Blockage of the kidney caused by a fragment of kidney stone. What happens before the procedure? Medicines Ask your health care provider about: Changing or stopping your regular medicines. This is especially important if you are taking diabetes medicines or  blood thinners. Taking medicines such as aspirin and ibuprofen. These medicines can thin your blood. Do not take these medicines unless your health care provider tells you to take them. Taking over-the-counter medicines, vitamins, herbs, and supplements. Eating and drinking Follow instructions from your health care provider about eating and drinking, which may include: 8 hours before the procedure - stop eating heavy meals or foods, such as meat, fried foods, or fatty foods. 6 hours before the procedure - stop eating light meals or foods, such as toast or cereal. 6 hours before the procedure - stop drinking milk or drinks that contain milk. 2 hours before the procedure - stop drinking clear liquids. Staying hydrated Follow instructions from your health care provider about hydration, which may include: Up to 2 hours before the procedure - you may continue to drink clear liquids, such as water, clear fruit juice, black coffee, and plain tea.  General instructions You may have a physical exam before the procedure. You may also have tests, such as imaging tests and blood or urine tests. If your ureter is too narrow, your health care provider may place a soft, flexible tube (stent) inside of it. The stent may be placed days or weeks before your laser therapy procedure. Plan to have someone take you home from the hospital or clinic. If you will be going home right after the procedure, plan to have someone stay with you for 24 hours. Do not use any products that contain nicotine or tobacco for at least 4 weeks before the procedure. These products include cigarettes, e-cigarettes, and chewing tobacco. If you need help quitting, ask your health care provider. Ask your health care provider: How your   surgical site will be marked or identified. What steps will be taken to help prevent infection. These may include: Removing hair at the surgery site. Washing skin with a germ-killing soap. Taking antibiotic  medicine. What happens during the procedure?  An IV will be inserted into one of your veins. You will be given one or more of the following: A medicine to help you relax (sedative). A medicine to numb the area (local anesthetic). A medicine to make you fall asleep (general anesthetic). A ureteroscope will be inserted into your urethra. The ureteroscope will send images to a video screen in the operating room to guide your surgeon to the area of your kidney that will be treated. A small, flexible tube will be threaded through the ureteroscope and into your bladder and ureter, up to your kidney. The laser device will be inserted into your kidney through the tube. Your surgeon will pulse the laser on and off to break up kidney stones. A surgical instrument that has a tiny wire basket may be inserted through the tube into your kidney to remove the pieces of broken kidney stone. The procedure may vary among health care providers and hospitals. What happens after the procedure? Your blood pressure, heart rate, breathing rate, and blood oxygen level will be monitored until you leave the hospital or clinic. You will be given pain medicine as needed. You may continue to receive antibiotics. You may have a stent temporarily placed in your ureter. Do not drive for 24 hours if you were given a sedative during your procedure. You may be given a strainer to collect any stone fragments that you pass in your urine. Your health care provider may have these tested. Summary Laser therapy for kidney stones is a procedure to break up kidney stones into pieces that are small enough to be passed out of the body through urination or removed during the procedure. Follow instructions from your health care provider about eating and drinking before the procedure. During the procedure, the ureteroscope will send images to a video screen to guide your surgeon to the area of your kidney that will be treated. Do not drive  for 24 hours if you were given a sedative during your procedure. This information is not intended to replace advice given to you by your health care provider. Make sure you discuss any questions you have with your health care provider. Document Revised: 05/30/2021 Document Reviewed: 05/30/2021 Elsevier Patient Education  [place].

## 2022-04-26 NOTE — Progress Notes (Incomplete)
04/26/22 6:59 AM   Valerie Cochran Valerie Cochran 1933-02-18 [id number]  Referring provider:  Marinda Elk, MD [facility]  [place] [id number] No chief complaint on file.     HPI: REMMIE Cochran is a 88 y.o.female with a personal history of  left kidney stone and right adrenal gland nodule who presents today from a 6 month follow-up.  CT abdomen and pelvis during ED visit on 10/10/2021 visualized a focally obstructive left 1.3 cm nephrolithiasis (inferior pole left calyceal dilatation). An incidentally noted Coarsely calcified 3.9 x 2.4 cm right adrenal gland nodule.She was discharged on tramadol.  In the absence of urinary symptoms, her urine was not treated and urinalysis was attributed to her nonobstructing stone.  She was advised to follow-up with urology.  She underwent CT abdomen w/wo contras on 04/07/2022 that visualized  stable diffusely calcified right adrenal mass, consistent with benign etiology.Duplicated left renal collecting system, with stable dilatation of lower pole moiety which contains a 13 mm calculus. Persistent focal focal urothelial contrast enhancement and narrowing at the left UPJ of the lower pole moiety. 13 mm nonobstructing left renal calculus.   PMH: Past Medical History:  Diagnosis Date   Diabetes mellitus without complication ([place])    Hypertension     Surgical History: Past Surgical History:  Procedure Laterality Date   ARTERY BIOPSY Right 07/23/2019   Procedure: BIOPSY TEMPORAL ARTERY;  Surgeon: Valerie Sprague, Valerie Cochran;  Location: [facility] ORS;  Service: General;  Laterality: Right;   SMALL INTESTINE SURGERY      Home Medications:  Allergies as of 04/26/2022       Reactions   Aspirin         Medication List        Accurate as of April 26, 2022  6:59 AM. If you have any questions, ask your nurse or doctor.          acetaminophen 500 MG tablet Commonly known as: TYLENOL Take 2 tablets (1,000 mg total) by mouth every 8  (eight) hours as needed for mild pain, moderate pain, fever or headache.   clopidogrel 75 MG tablet Commonly known as: PLAVIX Take 75 mg by mouth daily.   cyclobenzaprine 10 MG tablet Commonly known as: FLEXERIL Take 10 mg by mouth 3 (three) times daily as needed for muscle spasms.   DULoxetine 30 MG capsule Commonly known as: CYMBALTA Take 30 mg by mouth daily.   fluticasone 50 MCG/ACT nasal spray Commonly known as: FLONASE Place 2 sprays into the nose daily.   gabapentin 300 MG capsule Commonly known as: NEURONTIN Take 300 mg by mouth at bedtime.   losartan 100 MG tablet Commonly known as: COZAAR Take 100 mg by mouth daily.   metFORMIN 500 MG 24 hr tablet Commonly known as: GLUCOPHAGE-XR Take 1,000 mg by mouth every evening.   mirtazapine 7.5 MG tablet Commonly known as: REMERON Take 7.5 mg by mouth every evening.   omeprazole 20 MG capsule Commonly known as: PRILOSEC Take 20 mg by mouth 2 (two) times daily.   ondansetron 4 MG disintegrating tablet Commonly known as: ZOFRAN-ODT Take 1 tablet (4 mg total) by mouth every 8 (eight) hours as needed for nausea or vomiting.   potassium chloride SA 20 MEQ tablet Commonly known as: KLOR-CON M Take 20 mEq by mouth daily as needed.   rosuvastatin 5 MG tablet Commonly known as: CRESTOR Take 5 mg by mouth daily.   sulfamethoxazole-trimethoprim 800-160 MG tablet Commonly known as: BACTRIM  DS Take 1 tablet by mouth 2 (two) times daily.   traMADol 50 MG tablet Commonly known as: Ultram Take 1 tablet (50 mg total) by mouth every 6 (six) hours as needed.   traZODone 50 MG tablet Commonly known as: DESYREL Take 50 mg by mouth at bedtime as needed for sleep.   triamcinolone cream 0.5 % Commonly known as: KENALOG Apply 1 application topically 2 (two) times daily.   Trulicity 3 GH/8.2XH Sopn Generic drug: Dulaglutide INJECT 0.5 MLS (3 MG TOTAL) SUBCUTANEOUSLY ONCE A WEEK        Allergies:  Allergies  Allergen  Reactions   Aspirin     Family History: Family History  Problem Relation Age of Onset   Breast cancer Neg Hx     Social History:  reports that she has never smoked. She has never used smokeless tobacco. She reports that she does not drink alcohol and does not use drugs.   Physical Exam: There were no vitals taken for this visit.  Constitutional:  Alert and oriented, No acute distress. HEENT: [place] AT, moist mucus membranes.  Trachea midline, no masses. Cardiovascular: No clubbing, cyanosis, or edema. Respiratory: Normal respiratory effort, no increased work of breathing. Skin: No rashes, bruises or suspicious lesions. Neurologic: Grossly intact, no focal deficits, moving all 4 extremities. Psychiatric: Normal mood and affect.  Laboratory Data:  Lab Results  Component Value Date   CREATININE 0.90 04/07/2022   No results found for: "HGBA1C"  Urinalysis   Pertinent Imaging: CLINICAL DATA:  Follow-up indeterminate right adrenal mass. Nephrolithiasis.   EXAM: CT ABDOMEN WITHOUT AND WITH CONTRAST   TECHNIQUE: Multidetector CT imaging of the abdomen was performed following the standard protocol before and following the bolus administration of intravenous contrast.   RADIATION DOSE REDUCTION: This exam was performed according to the departmental dose-optimization program which includes automated exposure control, adjustment of the mA and/or kV according to patient size and/or use of iterative reconstruction technique.   CONTRAST:  130m OMNIPAQUE IOHEXOL 300 MG/ML  SOLN   COMPARISON:  10/09/2021   FINDINGS: Lower Chest: No acute findings.   Hepatobiliary: No hepatic masses identified. Gallbladder is unremarkable. No evidence of biliary ductal dilatation.   Pancreas:  No mass or inflammatory changes.   Spleen: Within normal limits in size and appearance.   Adrenals/Urinary Tract: A diffusely calcified right adrenal mass is again seen measuring 3.7 x 2.4 cm. This  remains stable since prior study. Contrast washout cannot be measured due to its diffuse calcification. Left adrenal gland remains normal in appearance.   Several small benign-appearing renal cysts are again seen bilaterally (no followup imaging recommended). No renal parenchymal masses identified.   13 mm calculus is again seen in a lower pole calyx of left kidney. Duplicated left renal collecting system is seen with dilatation of the lower pole moiety which is unchanged since recent exam. A focal area of urothelial enhancement and narrowing is seen at the UPJ of the lower pole collecting system.(see image 78/15). This raises possibility of urothelial neoplasm.   Stomach/Bowel:  Unremarkable.   Vascular/Lymphatic: No pathologically enlarged lymph nodes. No acute vascular findings. Aortic atherosclerotic calcification incidentally noted.   Other:  None.   Musculoskeletal:  No suspicious bone lesions identified.   IMPRESSION: Stable diffusely calcified right adrenal mass, consistent with benign etiology.   Duplicated left renal collecting system, with stable dilatation of lower pole moiety which contains a 13 mm calculus.   Persistent focal focal urothelial contrast enhancement and narrowing at  the left UPJ of the lower pole moiety. This raises possibility of urothelial neoplasm. Recommend correlation with urinalysis, and consider retrograde ureteroscopy for further evaluation.   13 mm nonobstructing left renal calculus.   Aortic Atherosclerosis (ICD10-I70.0).     Electronically Signed   By: Marlaine Hind M.D.   On: 04/08/2022 15:45     Score:  1-7 Mild 8-19 Moderate 20-35 Severe    Assessment & Plan:     No follow-ups on file.  I,Kailey Littlejohn,acting as a Education administrator for Hollice Espy, MD.,have documented all relevant documentation on the behalf of Hollice Espy, MD,as directed by  Hollice Espy, MD while in the presence of Hollice Espy,  [place] [address] [place], [place] [address] [phone]

## 2022-04-26 NOTE — Progress Notes (Signed)
04/26/22 11:19 AM   Linnell Fulling 29-Dec-1932 [id number]  Referring provider:  Marinda Elk, MD [place] [facility],  [place] [address] No chief complaint on file.     HPI: Valerie Cochran is a 88 y.o.female with a personal history of left kidney stone and right adrenal gland nodule, who presents today for a 6 month follow-up with CT results.   She underwent a CT abdomen and pevlis on 10/10/2021 during ED visit for abdominal pain. It visualized a focally obstructive left 1.3 cm nephrolithiasis (inferior pole left calyceal dilatation). An incidentally noted Coarsely calcified 3.9 x 2.4 cm right adrenal gland nodule  Follow-up CT on 04/07/2022 visualized stable diffusely calcified right adrenal mass, consistent with benign etiology. Duplicated left renal collecting system, with stable dilatation of lower pole moiety which contains a 13 mm calculus. Persistent focal focal urothelial contrast enhancement and narrowing at the left UPJ of the lower pole moiety. 13 mm nonobstructing left renal calculus.  She has a recent creatinine of 0.90 on 04/07/2022.   She denies any flank pain or urinary issues.  She is accompanied by her daughter today.   PMH: Past Medical History:  Diagnosis Date   Diabetes mellitus without complication ([place])    Hypertension     Surgical History: Past Surgical History:  Procedure Laterality Date   ARTERY BIOPSY Right 07/23/2019   Procedure: BIOPSY TEMPORAL ARTERY;  Surgeon: Benjamine Sprague, DO;  Location: [facility] ORS;  Service: General;  Laterality: Right;   SMALL INTESTINE SURGERY      Home Medications:  Allergies as of 04/26/2022       Reactions   Aspirin         Medication List        Accurate as of April 26, 2022 11:19 AM. If you have any questions, ask your nurse or doctor.          acetaminophen 500 MG tablet Commonly known as: TYLENOL Take 2 tablets (1,000 mg total) by mouth every 8 (eight) hours as needed  for mild pain, moderate pain, fever or headache.   clopidogrel 75 MG tablet Commonly known as: PLAVIX Take 75 mg by mouth daily.   cyclobenzaprine 10 MG tablet Commonly known as: FLEXERIL Take 10 mg by mouth 3 (three) times daily as needed for muscle spasms.   DULoxetine 30 MG capsule Commonly known as: CYMBALTA Take 30 mg by mouth daily.   fluticasone 50 MCG/ACT nasal spray Commonly known as: FLONASE Place 2 sprays into the nose daily.   gabapentin 300 MG capsule Commonly known as: NEURONTIN Take 300 mg by mouth at bedtime.   losartan 100 MG tablet Commonly known as: COZAAR Take 100 mg by mouth daily.   metFORMIN 500 MG 24 hr tablet Commonly known as: GLUCOPHAGE-XR Take 1,000 mg by mouth every evening.   mirtazapine 7.5 MG tablet Commonly known as: REMERON Take 7.5 mg by mouth every evening.   omeprazole 20 MG capsule Commonly known as: PRILOSEC Take 20 mg by mouth 2 (two) times daily.   ondansetron 4 MG disintegrating tablet Commonly known as: ZOFRAN-ODT Take 1 tablet (4 mg total) by mouth every 8 (eight) hours as needed for nausea or vomiting.   potassium chloride SA 20 MEQ tablet Commonly known as: KLOR-CON M Take 20 mEq by mouth daily as needed.   rosuvastatin 5 MG tablet Commonly known as: CRESTOR Take 5 mg by mouth daily.   sulfamethoxazole-trimethoprim 800-160 MG tablet Commonly known as: BACTRIM  DS Take 1 tablet by mouth 2 (two) times daily.   traMADol 50 MG tablet Commonly known as: Ultram Take 1 tablet (50 mg total) by mouth every 6 (six) hours as needed.   traZODone 50 MG tablet Commonly known as: DESYREL Take 50 mg by mouth at bedtime as needed for sleep.   triamcinolone cream 0.5 % Commonly known as: KENALOG Apply 1 application topically 2 (two) times daily.   Trulicity 3 IW/9.7LG Sopn Generic drug: Dulaglutide INJECT 0.5 MLS (3 MG TOTAL) SUBCUTANEOUSLY ONCE A WEEK        Allergies:  Allergies  Allergen Reactions   Aspirin      Family History: Family History  Problem Relation Age of Onset   Breast cancer Neg Hx     Social History:  reports that she has never smoked. She has never used smokeless tobacco. She reports that she does not drink alcohol and does not use drugs.   Physical Exam: There were no vitals taken for this visit.  Constitutional:  Alert and oriented, No acute distress. HEENT: [place] AT, moist mucus membranes.  Trachea midline, no masses. Cardiovascular: No clubbing, cyanosis, or edema. Respiratory: Normal respiratory effort, no increased work of breathing. Skin: No rashes, bruises or suspicious lesions. Neurologic: Grossly intact, no focal deficits, moving all 4 extremities. Psychiatric: Normal mood and affect.  Laboratory Data: Lab Results  Component Value Date   CREATININE 0.90 04/07/2022     Pertinent Imaging: CLINICAL DATA:  Follow-up indeterminate right adrenal mass. Nephrolithiasis.   EXAM: CT ABDOMEN WITHOUT AND WITH CONTRAST   TECHNIQUE: Multidetector CT imaging of the abdomen was performed following the standard protocol before and following the bolus administration of intravenous contrast.   RADIATION DOSE REDUCTION: This exam was performed according to the departmental dose-optimization program which includes automated exposure control, adjustment of the mA and/or kV according to patient size and/or use of iterative reconstruction technique.   CONTRAST:  171m OMNIPAQUE IOHEXOL 300 MG/ML  SOLN   COMPARISON:  10/09/2021   FINDINGS: Lower Chest: No acute findings.   Hepatobiliary: No hepatic masses identified. Gallbladder is unremarkable. No evidence of biliary ductal dilatation.   Pancreas:  No mass or inflammatory changes.   Spleen: Within normal limits in size and appearance.   Adrenals/Urinary Tract: A diffusely calcified right adrenal mass is again seen measuring 3.7 x 2.4 cm. This remains stable since prior study. Contrast washout cannot be measured  due to its diffuse calcification. Left adrenal gland remains normal in appearance.   Several small benign-appearing renal cysts are again seen bilaterally (no followup imaging recommended). No renal parenchymal masses identified.   13 mm calculus is again seen in a lower pole calyx of left kidney. Duplicated left renal collecting system is seen with dilatation of the lower pole moiety which is unchanged since recent exam. A focal area of urothelial enhancement and narrowing is seen at the UPJ of the lower pole collecting system.(see image 78/15). This raises possibility of urothelial neoplasm.   Stomach/Bowel:  Unremarkable.   Vascular/Lymphatic: No pathologically enlarged lymph nodes. No acute vascular findings. Aortic atherosclerotic calcification incidentally noted.   Other:  None.   Musculoskeletal:  No suspicious bone lesions identified.   IMPRESSION: Stable diffusely calcified right adrenal mass, consistent with benign etiology.   Duplicated left renal collecting system, with stable dilatation of lower pole moiety which contains a 13 mm calculus.   Persistent focal focal urothelial contrast enhancement and narrowing at the left UPJ of the lower pole moiety.  This raises possibility of urothelial neoplasm. Recommend correlation with urinalysis, and consider retrograde ureteroscopy for further evaluation.   13 mm nonobstructing left renal calculus.   Aortic Atherosclerosis (ICD10-I70.0).     Electronically Signed   By: Marlaine Hind M.D.   On: 04/08/2022 15:45   I have personally reviewed the images and agree with radiologist interpretation.   Assessment & Plan:    Focal urothelial contrast enhancement, left - Discussed conservative management versus surveillance versus definitive diagnostic work-up.  Options including monitoring symptoms, interval surveillance, versus diagnostic ureteroscopy with possible biopsy were all discussed.   -After discussion about the  risk and benefits along with her medical comorbidities, she actually has elected to want to pursue diagnostic ureteroscopy with possible biopsy.  She also like to have her stone treated at the same time.  We discussed ureteroscopy with laser lithotripsy possible biopsy and ureteral stent placement.  We discussed the risk including risk of bleeding, infection, damage surrounding structures, need for ureteral stent, and the possibility of need for staged procedure, approximately 10%. - Urine sent for cytology   2. Right adrenal gland nodule  - Stable on CT abdomen w/w/o contrast and not concerning at this time would not recommend further intervention /surveillance, likely benign  3. Left kidney stone  - Stable on CT abdomen w/w/o contrast  - She would like to proceed with ureteroscopy.  - We discussed the risk of ureteroscopy and that a stent will need to be placed.  - Urine sent for pre-op culture     I,Kailey Littlejohn,acting as a scribe for Hollice Espy, MD.,have documented all relevant documentation on the behalf of Hollice Espy, MD,as directed by  Hollice Espy, MD while in the presence of Hollice Espy, MD.  I have reviewed the above documentation for accuracy and completeness, and I agree with the above.   Hollice Espy, MD    [facility] [address], [place] [employer], [place] [address] [phone]

## 2022-04-28 ENCOUNTER — Telehealth: Payer: Self-pay

## 2022-04-28 LAB — CYTOLOGY - NON PAP

## 2022-04-28 NOTE — Telephone Encounter (Signed)
I spoke with Valerie Cochran and [name]. We have discussed possible surgery dates and Monday September 18th, 2023 was agreed upon by all parties. Patient given information about surgery date, what to expect pre-operatively and post operatively.  We discussed that a Pre-Admission Testing office will be calling to set up the pre-op visit that will take place prior to surgery, and that these appointments are typically done over the phone with a Pre-Admissions RN.  Informed patient that our office will communicate any additional care to be provided after surgery. Patients questions or concerns were discussed during our call. Advised to call our office should there be any additional information, questions or concerns that arise. Patient verbalized understanding.

## 2022-04-28 NOTE — Progress Notes (Signed)
[facility] Posting Form   Surgery Date/Time: Date: 06/26/2022  Surgeon: Dr. Hollice Espy, MD  Surgery Location: Day Surgery  Inpt ( No  )   Outpt (Yes)   Obs ( No  )   Diagnosis: N20.0 Left Nephrolithiasis, D41.12 Left Renal Pelvis Lesion  -CPT: 90240, 97353  Surgery: Left Ureteroscopy with laser lithotripsy and Stent Placement. Possible Left Renal Pelvis biopsy and possible laser fulguration of left renal pelvis  Stop Anticoagulations: Yes would like to hold Plavix, may continue ASA  Cardiac/Medical/Pulmonary Clearance needed: yes  Clearance needed from Dr: Clayborn Bigness  Clearance request sent on: Date: 04/28/22  *Orders entered into EPIC  Date: 04/28/22   *Case booked in EPIC  Date: 04/27/2022  *Notified pt of Surgery: Date: 04/27/2022  PRE-OP UA & CX: yes, will obtain in office on 06/15/2022  *Placed into Prior Authorization Work Fabio Bering Date: 04/28/22   Assistant/laser/rep:No

## 2022-04-29 LAB — CULTURE, URINE COMPREHENSIVE

## 2022-05-02 ENCOUNTER — Telehealth: Payer: Self-pay | Admitting: *Deleted

## 2022-05-02 NOTE — Telephone Encounter (Addendum)
Patient informed, voiced understanding    ----- Message from Hollice Espy, MD sent at 04/28/2022  2:31 PM EDT ----- Urine cytology negative, great news.  Hollice Espy, MD

## 2022-06-15 ENCOUNTER — Other Ambulatory Visit: Payer: Medicare Other

## 2022-06-15 DIAGNOSIS — N200 Calculus of kidney: Secondary | ICD-10-CM

## 2022-06-15 DIAGNOSIS — D4112 Neoplasm of uncertain behavior of left renal pelvis: Secondary | ICD-10-CM

## 2022-06-15 LAB — URINALYSIS, COMPLETE
Bilirubin, UA: NEGATIVE
Glucose, UA: NEGATIVE
Ketones, UA: NEGATIVE
Nitrite, UA: NEGATIVE
RBC, UA: NEGATIVE
Specific Gravity, UA: 1.025 (ref 1.005–1.030)
Urobilinogen, Ur: 0.2 mg/dL (ref 0.2–1.0)
pH, UA: 5 (ref 5.0–7.5)

## 2022-06-15 LAB — MICROSCOPIC EXAMINATION

## 2022-06-16 ENCOUNTER — Other Ambulatory Visit: Payer: Self-pay

## 2022-06-16 ENCOUNTER — Encounter
Admission: RE | Admit: 2022-06-16 | Discharge: 2022-06-16 | Disposition: A | Discharge status: Home / Self Care | Payer: Medicare Other | Source: Ambulatory Visit | Attending: Urology | Admitting: Urology

## 2022-06-16 VITALS — Ht 67.0 in | Wt 135.0 lb

## 2022-06-16 DIAGNOSIS — E119 Type 2 diabetes mellitus without complications: Secondary | ICD-10-CM

## 2022-06-16 HISTORY — DX: Other giant cell arteritis: M31.6

## 2022-06-16 HISTORY — DX: Personal history of urinary calculi: Z87.442

## 2022-06-16 HISTORY — DX: Polymyalgia rheumatica: M35.3

## 2022-06-16 HISTORY — DX: Gastro-esophageal reflux disease without esophagitis: K21.9

## 2022-06-16 NOTE — Progress Notes (Signed)
  Perioperative Services Pre-Admission/Anesthesia Testing     Date: 06/16/22  Name: Valerie Cochran MRN:   [id number]  Re: Request from surgery for clearance prior to scheduled procedure  Patient is scheduled to undergo a CYSTOSCOPY/URETEROSCOPY/HOLMIUM LASER/STENT PLACEMENT; RENAL PELVIS BIOPSY; LASER FULGERATION OF RENAL PELVIS LESION on 06/26/2022 with Dr. Hollice Espy, MD. Patient has not been scheduled for her PAT appointment at this point, thus has not undergone review by PAT RN and/or APP. Received communication from primary attending surgeon's office requesting that patient be submitted for clearance from cardiology.   PROVIDER SPECIALTY FAXED TO   Katrine Coho, MD  Cardiology  [phone]   Plan:  Clearance documents generated and faxed to appropriate provider(s) as noted above. Note will be updated to reflect communication with provider's office as it relates to clearance being provided and/or the need for office visit prior to clearance for surgery being issued.   Honor Loh, MSN, APRN, FNP-C, CEN [facility]  Peri-operative Services Nurse Practitioner Phone: [phone] 06/16/22 1:42 PM  NOTE: This note has been prepared using Dragon dictation software. Despite my best ability to proofread, there is always the potential that unintentional transcriptional errors may still occur from this process.

## 2022-06-16 NOTE — Pre-Procedure Instructions (Signed)
PAT interview done with patients daughter Abigail Butts via telephone. Instructions provided. She verbalizes understanding and is aware she can find copy in mychart under today's date AVS.

## 2022-06-16 NOTE — Patient Instructions (Signed)
Your procedure is scheduled on: 06/26/22 Report to [place]. To find out your arrival time please call [phone] between 1PM - 3PM on 06/23/22.  Remember: Instructions that are not followed completely may result in serious medical risk, up to and including death, or upon the discretion of your surgeon and anesthesiologist your surgery may need to be rescheduled.     _X__ 1. Do not eat food or drink any liquids after midnight the night before your procedure.                 No gum chewing or hard candies.   __X__2.  On the morning of surgery brush your teeth with toothpaste and water, you                 may rinse your mouth with mouthwash if you wish.  Do not swallow any              toothpaste of mouthwash.     _X__ 3.  No Alcohol for 24 hours before or after surgery.   _X__ 4.  Do Not Smoke or use e-cigarettes For 24 Hours Prior to Your Surgery.                 Do not use any chewable tobacco products for at least 6 hours prior to                 surgery.  ____  5.  Bring all medications with you on the day of surgery if instructed.   __X__  6.  Notify your doctor if there is any change in your medical condition      (cold, fever, infections).     Do not wear jewelry, make-up, hairpins, clips or nail polish. Do not wear lotions, powders, or perfumes. You may wear deodorant Do not shave body hair 48 hours prior to surgery. Men may shave face and neck. Do not bring valuables to the hospital.    [facility] is not responsible for any belongings or valuables.  Contacts, dentures/partials or body piercings may not be worn into surgery. Bring a case for your contacts, glasses or hearing aids, a denture cup will be supplied. Leave your suitcase in the car. After surgery it may be brought to your room. For patients admitted to the hospital, discharge time is determined by your treatment team.   Patients discharged the day of  surgery will not be allowed to drive home.    __X__ Take these medicines the morning of surgery with A SIP OF WATER:    1. DULoxetine (CYMBALTA) 30 MG capsule  2. metoprolol succinate (TOPROL-XL) 50 MG 24 hr tablet  3. omeprazole (PRILOSEC) 20 MG capsule  4. rosuvastatin (CRESTOR) 5 MG tablet  5.  6.  ____ Fleet Enema (as directed)   ____ Use CHG Soap/SAGE wipes as directed  ____ Use inhalers on the day of surgery  __X__ Stop metformin/Janumet/Farxiga 2 days prior to surgery  Last dose Sat am 9/16  ____ Take 1/2 of usual insulin dose the night before surgery. No insulin the morning          of surgery.   __X__ Stop Blood Thinners Coumadin/Plavix/Xarelto/Pleta/Pradaxa/Eliquis/Effient/Aspirin FOR 5 DAYS  last dose Wed 9/13  __X__ Stop Anti-inflammatories 7 days before surgery such as Advil, Ibuprofen, Motrin,  BC or Goodies Powder, Naprosyn, Naproxen, Aleve, Aspirin    __X__ Stop all herbals and supplements, fish oil  or vitamins for 7 days until after surgery.    ____ Bring C-Pap to the hospital.

## 2022-06-19 LAB — CULTURE, URINE COMPREHENSIVE

## 2022-06-20 ENCOUNTER — Telehealth: Payer: Self-pay | Admitting: Urgent Care

## 2022-06-20 ENCOUNTER — Telehealth: Payer: Self-pay

## 2022-06-20 MED ORDER — SULFAMETHOXAZOLE-TRIMETHOPRIM 800-160 MG PO TABS: 1.0000 | ORAL_TABLET | Freq: Two times a day (BID) | ORAL | 0 refills | Status: DC

## 2022-06-20 NOTE — Progress Notes (Signed)
  Perioperative Services Pre-Admission/Anesthesia Testing     Date: 06/20/22  Name: Valerie Cochran MRN:   [id number]  Re: Surgical clearance for CYSTOSCOPY/URETEROSCOPY/HOLMIUM LASER/STENT PLACEMENT (Left); RENAL PELVIS BIOPSY (Left); LASER FULGERATION OF RENAL PELVIS LESION (Left)  Surgical clearance received from Dr. Katrine Coho. MD. CARDIOLOGY has cleared the patient for the above procedure and advises that she may proceed with an overall LOW risk stratification. She has been cleared to hold her CLOPIDOGREL dose for 5 days prior to her  procedure with plans to restart as soon as postoperative bleeding risk to be minimized by her primary attending surgeon. Copy of signed clearance form placed on patient's OR chart for review by the surgical/anesthetic team on the day of his procedure.   Honor Loh, MSN, APRN, FNP-C, CEN [facility]  Peri-operative Services Nurse Practitioner Phone: [phone] 06/20/22 7:57 AM

## 2022-06-20 NOTE — Telephone Encounter (Signed)
Spoke with patient's daughter, advised to start ABX on 06/23/2022. Also discussed holding Plavix starting tomorrow. Abigail Butts verbalized understanding.

## 2022-06-20 NOTE — Telephone Encounter (Signed)
-----   Message from Hollice Espy, MD sent at 05/01/2022  7:46 AM EDT ----- Preop urine culture was positive but she is otherwise asymptomatic.  I would recommend that she start Bactrim DS twice daily starting 3 days before the surgery for total of 5 days.  Hollice Espy, MD

## 2022-06-21 ENCOUNTER — Encounter: Payer: Self-pay | Admitting: Urology

## 2022-06-21 NOTE — Progress Notes (Signed)
Perioperative Services  Pre-Admission/Anesthesia Testing Clinical Review  Date: 06/22/22  Patient Demographics:  Name: Valerie Cochran DOB:   08-20-1933 MRN:   [id number]  Planned Surgical Procedure(s):    Case: [id number] Date/Time: 06/26/22 1134   Procedures:      CYSTOSCOPY/URETEROSCOPY/HOLMIUM LASER/STENT PLACEMENT (Left)     CYSTOSCOPY WITH RENAL PELVIS BIOPSY (Left)     CYSTOSCOPY WITH LASER FULGURATION OF RENAL PELVIS LESION (Left)   Anesthesia type: General   Pre-op diagnosis: Left Nephrolithiasis, Left renal Pelvic Lesion   Location: [facility] OR ROOM 10 / [place] ORS FOR ANESTHESIA GROUP   Surgeons: Hollice Espy, MD   NOTE: Available PAT nursing documentation and vital signs have been reviewed. Clinical nursing staff has updated patient's PMH/PSHx, current medication list, and drug allergies/intolerances to ensure comprehensive history available to assist in medical decision making as it pertains to the aforementioned surgical procedure and anticipated anesthetic course. Extensive review of available clinical information performed. [place] PMH and PSHx updated with any diagnoses/procedures that  may have been inadvertently omitted during her intake with the pre-admission testing department's nursing staff.  Clinical Discussion:  Valerie Cochran is a 88 y.o. female who is submitted for pre-surgical anesthesia review and clearance prior to her undergoing the above procedure. Patient has never been a smoker. Pertinent PMH includes: atypical chest pain, diastolic dysfunction, aortic atherosclerosis, cardiac murmur, HTN, HLD, T2DM, GERD (on daily PPI), polymyalgia rheumatica syndrome, peripheral edema, nephrolithiasis.  Patient is followed by cardiology Clayborn Bigness, MD). She was last seen in the cardiology clinic on 04/25/2022; notes reviewed.  At the time of her clinic visit, patient doing well overall from a cardiovascular perspective.  During her previous visit, patient had complained of chest  pain, which prompted further cardiovascular work-up.  Since having study performed, patient denies any recurrent episodes of chest pain, shortness of breath, PND, orthopnea, palpitations, significant peripheral edema, vertiginous symptoms, or presyncope/syncope.  Patient with past medical history significant for cardiovascular diagnoses.  Most recent TTE was performed on 02/08/2022 revealing a normal left ventricular systolic function with an EF of >55%.  There was mild LVH. Diastolic Doppler parameters consistent with abnormal relaxation (G1DD). Right ventricular size and function normal.  Mild aortic, tricuspid, pulmonary, in addition to moderate mitral valve regurgitation noted.  There was no evidence of a significant transvalvular gradient to suggest stenosis.  Myocardial perfusion imaging study performed on 04/12/2022 revealed a hyperdynamic left ventricular systolic function with an EF of 78%.  There were no regional wall motion abnormalities noted.  There was no evidence of stress-induced myocardial ischemia or arrhythmia; no scintigraphic evidence of scar.  Study determined to be normal and low risk.  Patient on daily antiplatelet therapy using clopidogrel.  She is reportedly compliant with therapy with no evidence or reports of GI bleeding.  Blood pressure found to be elevated at 152/96 mmHg on currently prescribed diuretic (furosemide), ARB (losartan), and beta-blocker (metoprolol succinate) therapies.  Patient is on rosuvastatin for her HLD diagnosis and further ASCVD prevention.  T2DM well-controlled on currently prescribed regimen; last HgbA1c was 6.6% when checked on 05/25/2022.  Functional capacity limited by age and multiple medical comorbidities, however without being said, patient still felt to be able to achieve at least 4 METS of activity without experiencing any angina/anginal equivalent symptoms.  MD considered advancing beta-blocker and ARB therapies or potentially adding daily diuretic,  however no changes were made during her visit with cardiology.  Patient to follow-up with outpatient cardiology in 4 months or sooner if  needed.  Valerie Cochran is scheduled for an CYSTOSCOPY/URETEROSCOPY/HOLMIUM LASER/STENT PLACEMENT; RENAL PELVIS BIOPSY;  LASER FULGURATION OF RENAL PELVIS LESION on 06/26/2022 with Dr. Hollice Espy, MD. Given patient's past medical history significant for cardiovascular diagnoses, presurgical cardiac clearance was sought by the PAT team. Per cardiology, "this patient is optimized for surgery and may proceed with the planned procedural course with a LOW risk of significant perioperative cardiovascular complications". Again, it is noted that patient is on daily antiplatelet therapy.  She has been instructed on recommendations from her cardiologist for holding her clopidogrel dose for 5 days prior to her procedure with plans to restart as soon as postoperatively respectively minimized by her primary attending surgeon.  The patient is aware that her last dose of clopidogrel should be on 06/20/2022  Patient denies previous perioperative complications with anesthesia in the past. In review of the available records, it is noted that patient underwent a general anesthetic course here at [facility] ([facility]) in 07/2019 without documented complications.      06/16/2022    1:29 PM 04/26/2022    1:42 PM 10/13/2021    9:38 AM  Vitals with BMI  Height '5\' 7"'$  '5\' 6"'$  '5\' 4"'$   Weight 135 lbs 141 lbs 140 lbs  BMI 21.14 24.82 50.03  Systolic  704 888  Diastolic  80 89  Pulse  96 81    Providers/Specialists:   NOTE: Primary physician provider listed below. Patient may have been seen by APP or partner within same practice.   PROVIDER ROLE / SPECIALTY LAST Lu Duffel, MD Urology (Surgeon) 04/26/2022  Marinda Elk, MD Primary Care Provider 05/25/2022  Katrine Coho, MD Cardiology 04/25/2022  Ephriam Jenkins, MD Rheumatology 06/07/2022   Mee Hives, MD Endocrinology 05/30/2022   Allergies:  Aspirin  Current Home Medications:   No current facility-administered medications for this encounter.    acetaminophen (TYLENOL) 500 MG tablet   alendronate (FOSAMAX) 70 MG tablet   clopidogrel (PLAVIX) 75 MG tablet   cyclobenzaprine (FLEXERIL) 10 MG tablet   DULoxetine (CYMBALTA) 30 MG capsule   fluticasone (FLONASE) 50 MCG/ACT nasal spray   furosemide (LASIX) 20 MG tablet   gabapentin (NEURONTIN) 100 MG capsule   hydroxychloroquine (PLAQUENIL) 200 MG tablet   losartan (COZAAR) 100 MG tablet   metFORMIN (GLUCOPHAGE-XR) 500 MG 24 hr tablet   metoprolol succinate (TOPROL-XL) 50 MG 24 hr tablet   mirtazapine (REMERON) 15 MG tablet   omeprazole (PRILOSEC) 20 MG capsule   potassium chloride SA (KLOR-CON) 20 MEQ tablet   rosuvastatin (CRESTOR) 5 MG tablet   sulfamethoxazole-trimethoprim (BACTRIM DS) 800-160 MG tablet   sulfamethoxazole-trimethoprim (BACTRIM DS) 800-160 MG tablet   traMADol (ULTRAM) 50 MG tablet   traZODone (DESYREL) 50 MG tablet   triamcinolone cream (KENALOG) 0.5 %   History:   Past Medical History:  Diagnosis Date   Aortic atherosclerosis (HCC)    Atypical chest pain    Diastolic dysfunction    a.) TTE 01/29/2015: EF 45%, mild global HK, triv PR, mild AR/MR/TR, G1DD; b.) TTE 05/13/2018: EF >55%, mild panvalvular regurgitation; c.) TTE 02/08/2022: EF >55%, mild LVH, mild AR/TR/PR, mod MR, G1DD   GERD (gastroesophageal reflux disease)    Gout    History of kidney stones    HLD (hyperlipidemia)    Hypertension    Long term current use of antithrombotics/antiplatelets    a.) clopidogrel   Long term current use of immunosuppressive drug    a.) hydroxycholorquine +  prednisone   Murmur    Nephrolithiasis    Osteopenia of multiple sites    Peripheral edema    Peripheral neuropathy    Polymyalgia rheumatica syndrome (HCC)    Right adrenal mass (HCC)    T2DM (type 2 diabetes mellitus) ([place])     Temporal arteritis ([place])    Past Surgical History:  Procedure Laterality Date   ARTERY BIOPSY Right 07/23/2019   Procedure: BIOPSY TEMPORAL ARTERY;  Surgeon: Benjamine Sprague, DO;  Location: [facility] ORS;  Service: General;  Laterality: Right;   SMALL INTESTINE SURGERY     Family History  Problem Relation Age of Onset   Breast cancer Neg Hx    Social History   Tobacco Use   Smoking status: Never   Smokeless tobacco: Never  Vaping Use   Vaping Use: Never used  Substance Use Topics   Alcohol use: No   Drug use: No    Pertinent Clinical Results:  LABS: Labs reviewed: Acceptable for surgery.        Component Date Value Ref Range Status   Urine Culture, Comprehensive 06/15/2022 Final report   Final   Organism ID, Bacteria 06/15/2022 Comment   Final   Comment: Mixed urogenital flora 10,000-25,000 colony forming units per mL    Specific Gravity, UA 06/15/2022 1.025  1.005 - 1.030 Final   pH, UA 06/15/2022 5.0  5.0 - 7.5 Final   Color, UA 06/15/2022 Yellow  Yellow Final   Appearance Ur 06/15/2022 Clear  Clear Final   Leukocytes,UA 06/15/2022 Trace (A)  Negative Final   Protein,UA 06/15/2022 Trace (A)  Negative/Trace Final   Glucose, UA 06/15/2022 Negative  Negative Final   Ketones, UA 06/15/2022 Negative  Negative Final   RBC, UA 06/15/2022 Negative  Negative Final   Bilirubin, UA 06/15/2022 Negative  Negative Final   Urobilinogen, Ur 06/15/2022 0.2  0.2 - 1.0 mg/dL Final   Nitrite, UA 06/15/2022 Negative  Negative Final   Microscopic Examination 06/15/2022 See below:   Final   WBC, UA 06/15/2022 6-10 (A)  0 - 5 /hpf Final   RBC, Urine 06/15/2022 0-2  0 - 2 /hpf Final   Epithelial Cells (non renal) 06/15/2022 0-10  0 - 10 /hpf Final   Casts 06/15/2022 Present (A)  None seen /lpf Final   Cast Type 06/15/2022 Granular casts (A)  N/A Final   Bacteria, UA 06/15/2022 Many (A)  None seen/Few Final    ECG: Date: 04/12/2022 Obtained from ECG stress test Rhythm: normal sinus at  rate of 81 bpm Comparison: Similar to previous tracing obtained on 11/14/2019   IMAGING / PROCEDURES: MYOCARDIAL PERFUSION IMAGING STUDY (LEXISCAN) performed on 04/12/2022 Normal left ventricular systolic function with a hyperdynamic LVEF of 78% Normal myocardial thickening and wall motion Left ventricular cavity size normal SPECT images demonstrate homogenous tracer distribution throughout the myocardium No evidence of stress-induced myocardial ischemia or arrhythmia Normal low risk study  CT ABDOMEN W WO CONTRAST performed on 04/07/2022 Stable diffusely calcified right adrenal mass, consistent with benign etiology. Duplicated left renal collecting system, with stable dilatation of lower pole moiety which contains a 13 mm calculus. Persistent focal urothelial contrast enhancement and narrowing at the left UPJ of the lower pole moiety. This raises possibility of urothelial neoplasm. Recommend correlation with urinalysis, and consider retrograde ureteroscopy for further evaluation. 13 mm nonobstructing left renal calculus. Aortic atherosclerosis  TRANSTHORACIC ECHOCARDIOGRAM performed on 02/08/2022 Normal left ventricular systolic function with mild LVH; LVEF >16% Diastolic Doppler parameters consistent with abnormal relaxation (  G1DD). Normal right ventricular systolic function Mild AR, TR, PR Moderate MR Normal transvalvular gradients; no valvular stenosis No pericardial effusion  Impression and Plan:  Valerie Cochran has been referred for pre-anesthesia review and clearance prior to her undergoing the planned anesthetic and procedural courses. Available labs, pertinent testing, and imaging results were personally reviewed by me. This patient has been appropriately cleared by cardiology with an overall LOW risk of significant perioperative cardiovascular complications.  Based on clinical review performed today (06/22/22), barring any significant acute changes in the patient's overall  condition, it is anticipated that she will be able to proceed with the planned surgical intervention. Any acute changes in clinical condition may necessitate her procedure being postponed and/or cancelled. Patient will meet with anesthesia team (MD and/or CRNA) on the day of her procedure for preoperative evaluation/assessment. Questions regarding anesthetic course will be fielded at that time.   Pre-surgical instructions were reviewed with the patient during her PAT appointment and questions were fielded by PAT clinical staff. Patient was advised that if any questions or concerns arise prior to her procedure then she should return a call to PAT and/or her surgeon's office to discuss.  Honor Loh, MSN, APRN, FNP-C, CEN [facility]  Peri-operative Services Nurse Practitioner Phone: [phone] Fax: [phone] 06/22/22 2:17 PM  NOTE: This note has been prepared using Dragon dictation software. Despite my best ability to proofread, there is always the potential that unintentional transcriptional errors may still occur from this process.

## 2022-06-25 MED ORDER — CHLORHEXIDINE GLUCONATE 0.12 % MT SOLN: 15.0000 mL | Freq: Once | OROMUCOSAL | Status: AC

## 2022-06-25 MED ORDER — SODIUM CHLORIDE 0.9 % IV SOLN: INTRAVENOUS | Status: DC

## 2022-06-25 MED ORDER — ORAL CARE MOUTH RINSE: 15.0000 mL | Freq: Once | OROMUCOSAL | Status: AC

## 2022-06-25 MED ORDER — CEFAZOLIN SODIUM-DEXTROSE 2-4 GM/100ML-% IV SOLN
2.0000 g | INTRAVENOUS | Status: AC
  Administered 2022-06-26: 2 g via INTRAVENOUS

## 2022-06-26 ENCOUNTER — Encounter: Payer: Self-pay | Admitting: Urology

## 2022-06-26 ENCOUNTER — Encounter
Admission: RE | Disposition: A | Discharge status: Home / Self Care | Payer: Self-pay | Source: Home / Self Care | Attending: Urology

## 2022-06-26 ENCOUNTER — Ambulatory Visit: Payer: Medicare Other | Admitting: Urgent Care

## 2022-06-26 ENCOUNTER — Ambulatory Visit: Payer: Medicare Other

## 2022-06-26 ENCOUNTER — Ambulatory Visit
Admission: RE | Admit: 2022-06-26 | Discharge: 2022-06-26 | Disposition: A | Discharge status: Home / Self Care | Payer: Medicare Other | Attending: Urology | Admitting: Urology

## 2022-06-26 ENCOUNTER — Other Ambulatory Visit: Payer: Self-pay

## 2022-06-26 DIAGNOSIS — Z7984 Long term (current) use of oral hypoglycemic drugs: Secondary | ICD-10-CM | POA: Insufficient documentation

## 2022-06-26 DIAGNOSIS — N2889 Other specified disorders of kidney and ureter: Secondary | ICD-10-CM | POA: Diagnosis not present

## 2022-06-26 DIAGNOSIS — E785 Hyperlipidemia, unspecified: Secondary | ICD-10-CM | POA: Diagnosis not present

## 2022-06-26 DIAGNOSIS — E119 Type 2 diabetes mellitus without complications: Secondary | ICD-10-CM

## 2022-06-26 DIAGNOSIS — I10 Essential (primary) hypertension: Secondary | ICD-10-CM | POA: Insufficient documentation

## 2022-06-26 DIAGNOSIS — D4112 Neoplasm of uncertain behavior of left renal pelvis: Secondary | ICD-10-CM

## 2022-06-26 DIAGNOSIS — E1142 Type 2 diabetes mellitus with diabetic polyneuropathy: Secondary | ICD-10-CM | POA: Diagnosis not present

## 2022-06-26 DIAGNOSIS — N200 Calculus of kidney: Secondary | ICD-10-CM | POA: Diagnosis present

## 2022-06-26 DIAGNOSIS — Z7902 Long term (current) use of antithrombotics/antiplatelets: Secondary | ICD-10-CM | POA: Diagnosis not present

## 2022-06-26 DIAGNOSIS — E279 Disorder of adrenal gland, unspecified: Secondary | ICD-10-CM | POA: Insufficient documentation

## 2022-06-26 DIAGNOSIS — K219 Gastro-esophageal reflux disease without esophagitis: Secondary | ICD-10-CM | POA: Insufficient documentation

## 2022-06-26 DIAGNOSIS — I700 Atherosclerosis of aorta: Secondary | ICD-10-CM | POA: Diagnosis not present

## 2022-06-26 HISTORY — DX: Other specified disorders of adrenal gland: E27.8

## 2022-06-26 HISTORY — DX: Hyperlipidemia, unspecified: E78.5

## 2022-06-26 HISTORY — PX: CYSTOSCOPY WITH BIOPSY: SHX5122

## 2022-06-26 HISTORY — DX: Edema, unspecified: R60.9

## 2022-06-26 HISTORY — DX: Other ill-defined heart diseases: I51.89

## 2022-06-26 HISTORY — DX: Polyneuropathy, unspecified: G62.9

## 2022-06-26 HISTORY — DX: Other specified disorders of bone density and structure, multiple sites: M85.89

## 2022-06-26 HISTORY — DX: Other chest pain: R07.89

## 2022-06-26 HISTORY — PX: CYSTOSCOPY/URETEROSCOPY/HOLMIUM LASER/STENT PLACEMENT: SHX6546

## 2022-06-26 HISTORY — DX: Localized edema: R60.0

## 2022-06-26 HISTORY — DX: Long term (current) use of antithrombotics/antiplatelets: Z79.02

## 2022-06-26 HISTORY — DX: Gout, unspecified: M10.9

## 2022-06-26 HISTORY — DX: Long term (current) use of unspecified immunomodulators and immunosuppressants: Z79.60

## 2022-06-26 HISTORY — DX: Type 2 diabetes mellitus without complications: E11.9

## 2022-06-26 HISTORY — PX: CYSTOSCOPY WITH FULGERATION: SHX6638

## 2022-06-26 HISTORY — DX: Cardiac murmur, unspecified: R01.1

## 2022-06-26 HISTORY — DX: Calculus of kidney: N20.0

## 2022-06-26 HISTORY — DX: Other long term (current) drug therapy: Z79.899

## 2022-06-26 HISTORY — DX: Atherosclerosis of aorta: I70.0

## 2022-06-26 LAB — GLUCOSE, CAPILLARY
Glucose-Capillary: 53 mg/dL — ABNORMAL LOW (ref 70–99)
Glucose-Capillary: 88 mg/dL (ref 70–99)
Glucose-Capillary: 48 mg/dL — ABNORMAL LOW (ref 70–99)
Glucose-Capillary: 166 mg/dL — ABNORMAL HIGH (ref 70–99)
Glucose-Capillary: 98 mg/dL (ref 70–99)

## 2022-06-26 SURGERY — CYSTOSCOPY/URETEROSCOPY/HOLMIUM LASER/STENT PLACEMENT
Anesthesia: General | Laterality: Left

## 2022-06-26 MED ORDER — ONDANSETRON HCL 4 MG/2ML IJ SOLN
INTRAMUSCULAR | Status: DC | PRN
  Administered 2022-06-26: 4 mg via INTRAVENOUS

## 2022-06-26 MED ORDER — MIDAZOLAM HCL 2 MG/2ML IJ SOLN
INTRAMUSCULAR | Status: AC
  Filled 2022-06-26: qty 2

## 2022-06-26 MED ORDER — LIDOCAINE HCL (CARDIAC) PF 100 MG/5ML IV SOSY
PREFILLED_SYRINGE | INTRAVENOUS | Status: DC | PRN
  Administered 2022-06-26: 60 mg via INTRAVENOUS

## 2022-06-26 MED ORDER — DEXAMETHASONE SODIUM PHOSPHATE 10 MG/ML IJ SOLN
INTRAMUSCULAR | Status: DC | PRN
  Administered 2022-06-26: 10 mg via INTRAVENOUS

## 2022-06-26 MED ORDER — HYDROCODONE-ACETAMINOPHEN 5-325 MG PO TABS: 1.0000 | ORAL_TABLET | Freq: Four times a day (QID) | ORAL | 0 refills | Status: AC | PRN

## 2022-06-26 MED ORDER — SODIUM CHLORIDE 0.9 % IR SOLN
Status: DC | PRN
  Administered 2022-06-26: 3000 mL

## 2022-06-26 MED ORDER — PHENYLEPHRINE HCL (PRESSORS) 10 MG/ML IV SOLN
INTRAVENOUS | Status: DC | PRN
  Administered 2022-06-26: 80 ug via INTRAVENOUS

## 2022-06-26 MED ORDER — FENTANYL CITRATE (PF) 100 MCG/2ML IJ SOLN
INTRAMUSCULAR | Status: AC
  Filled 2022-06-26: qty 2

## 2022-06-26 MED ORDER — DEXTROSE 50 % IV SOLN
12.5000 mL | Freq: Once | INTRAVENOUS | Status: AC
  Administered 2022-06-26: 12.5 mL via INTRAVENOUS

## 2022-06-26 MED ORDER — CEFAZOLIN SODIUM-DEXTROSE 2-4 GM/100ML-% IV SOLN
INTRAVENOUS | Status: AC
  Filled 2022-06-26: qty 100

## 2022-06-26 MED ORDER — IOHEXOL 180 MG/ML  SOLN
INTRAMUSCULAR | Status: DC | PRN
  Administered 2022-06-26: 30 mL

## 2022-06-26 MED ORDER — FENTANYL CITRATE (PF) 100 MCG/2ML IJ SOLN
INTRAMUSCULAR | Status: DC | PRN
  Administered 2022-06-26: 25 ug via INTRAVENOUS

## 2022-06-26 MED ORDER — CHLORHEXIDINE GLUCONATE 0.12 % MT SOLN
OROMUCOSAL | Status: AC
  Administered 2022-06-26: 15 mL via OROMUCOSAL
  Filled 2022-06-26: qty 15

## 2022-06-26 MED ORDER — ROCURONIUM BROMIDE 100 MG/10ML IV SOLN
INTRAVENOUS | Status: DC | PRN
  Administered 2022-06-26: 40 mg via INTRAVENOUS
  Administered 2022-06-26: 10 mg via INTRAVENOUS

## 2022-06-26 MED ORDER — PROPOFOL 10 MG/ML IV BOLUS
INTRAVENOUS | Status: DC | PRN
  Administered 2022-06-26: 100 mg via INTRAVENOUS

## 2022-06-26 MED ORDER — MIDAZOLAM HCL 2 MG/2ML IJ SOLN
INTRAMUSCULAR | Status: DC | PRN
  Administered 2022-06-26: 2 mg via INTRAVENOUS

## 2022-06-26 MED ORDER — OXYBUTYNIN CHLORIDE 5 MG PO TABS: 5.0000 mg | ORAL_TABLET | Freq: Three times a day (TID) | ORAL | 0 refills | Status: DC | PRN

## 2022-06-26 MED ORDER — DEXTROSE 50 % IV SOLN: 12.5000 mL | Freq: Once | INTRAVENOUS | Status: DC

## 2022-06-26 MED ORDER — SUGAMMADEX SODIUM 200 MG/2ML IV SOLN
INTRAVENOUS | Status: DC | PRN
  Administered 2022-06-26: 200 mg via INTRAVENOUS

## 2022-06-26 MED ORDER — DEXTROSE 50 % IV SOLN
INTRAVENOUS | Status: AC
  Administered 2022-06-26: 12.5 mL via INTRAVENOUS
  Filled 2022-06-26: qty 50

## 2022-06-26 SURGICAL SUPPLY — 44 items
ADH LQ OCL WTPRF AMP STRL LF (MISCELLANEOUS)
ADHESIVE MASTISOL STRL (MISCELLANEOUS) IMPLANT
BAG DRAIN SIEMENS DORNER NS (MISCELLANEOUS) ×1 IMPLANT
BAG DRN NS LF (MISCELLANEOUS) ×1
BASKET ZERO TIP 1.9FR (BASKET) IMPLANT
BRUSH CYTOLOGY 3FR 115L (MISCELLANEOUS) IMPLANT
BRUSH SCRUB EZ  4% CHG (MISCELLANEOUS) ×1
BRUSH SCRUB EZ 1% IODOPHOR (MISCELLANEOUS) ×1 IMPLANT
BRUSH SCRUB EZ 4% CHG (MISCELLANEOUS) ×1 IMPLANT
BSKT STON RTRVL ZERO TP 1.9FR (BASKET)
CATH URET FLEX-TIP 2 LUMEN 10F (CATHETERS) IMPLANT
CATH URETL OPEN 5X70 (CATHETERS) ×1 IMPLANT
CNTNR SPEC 2.5X3XGRAD LEK (MISCELLANEOUS) ×1
CONT SPEC 4OZ STER OR WHT (MISCELLANEOUS) ×1
CONT SPEC 4OZ STRL OR WHT (MISCELLANEOUS) ×1
CONTAINER SPEC 2.5X3XGRAD LEK (MISCELLANEOUS) IMPLANT
DRAPE UTILITY 15X26 TOWEL STRL (DRAPES) ×1 IMPLANT
DRSG TEGADERM 2-3/8X2-3/4 SM (GAUZE/BANDAGES/DRESSINGS) IMPLANT
DRSG TELFA 3X4 N-ADH STERILE (GAUZE/BANDAGES/DRESSINGS) ×1 IMPLANT
ELECT REM PT RETURN 9FT ADLT (ELECTROSURGICAL) ×1
ELECTRODE REM PT RTRN 9FT ADLT (ELECTROSURGICAL) ×1 IMPLANT
FIBER LASER MOSES 200 DFL (Laser) IMPLANT
FORCEPS BIOP PIRANHA Y (CUTTING FORCEPS) IMPLANT
GAUZE 4X4 16PLY ~~LOC~~+RFID DBL (SPONGE) ×2 IMPLANT
GLOVE BIO SURGEON STRL SZ 6.5 (GLOVE) ×1 IMPLANT
GLOVE BIOGEL M 6.5 STRL (GLOVE) ×1 IMPLANT
GOWN STRL REUS W/ TWL LRG LVL3 (GOWN DISPOSABLE) ×2 IMPLANT
GOWN STRL REUS W/TWL LRG LVL3 (GOWN DISPOSABLE) ×2
GUIDEWIRE GREEN .038 145CM (MISCELLANEOUS) IMPLANT
GUIDEWIRE STR DUAL SENSOR (WIRE) ×1 IMPLANT
IV NS IRRIG 3000ML ARTHROMATIC (IV SOLUTION) ×1 IMPLANT
KIT TURNOVER CYSTO (KITS) ×1 IMPLANT
MANIFOLD NEPTUNE II (INSTRUMENTS) ×1 IMPLANT
NDL SAFETY ECLIP 18X1.5 (MISCELLANEOUS) ×1 IMPLANT
PACK CYSTO AR (MISCELLANEOUS) ×1 IMPLANT
SET CYSTO W/LG BORE CLAMP LF (SET/KITS/TRAYS/PACK) ×1 IMPLANT
SHEATH NAVIGATOR HD 12/14X36 (SHEATH) IMPLANT
STENT URET 6FRX24 CONTOUR (STENTS) IMPLANT
STENT URET 6FRX26 CONTOUR (STENTS) IMPLANT
SURGILUBE 2OZ TUBE FLIPTOP (MISCELLANEOUS) ×1 IMPLANT
TRAP FLUID SMOKE EVACUATOR (MISCELLANEOUS) ×1 IMPLANT
WATER STERILE IRR 1000ML POUR (IV SOLUTION) ×1 IMPLANT
WATER STERILE IRR 3000ML UROMA (IV SOLUTION) ×1 IMPLANT
WATER STERILE IRR 500ML POUR (IV SOLUTION) ×1 IMPLANT

## 2022-06-26 NOTE — Op Note (Signed)
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  Unable to adequately visualize an access left lower pole moiety due to severe infundibular stenosis.  Adjacent urothelium was biopsied as well as a cytology brush advanced through this stenotic infundibulum.    EBL: Minimal  Specimens: Left urothelial biopsy, left selective renal pelvic cytology  Drains: 6 x 24 French double-J ureteral stent on left  Indication: Valerie Cochran is a 88 y.o. patient with left lower pole stone as well as urothelial enhancement concerning for possible underlying neoplasm.  After reviewing the management options for treatment, he elected to proceed with the above surgical procedure(s). We have discussed the potential benefits and risks of the procedure, side effects of the proposed treatment, the likelihood of the patient achieving the goals of the procedure, and any potential problems that might occur during the procedure or recuperation. Informed consent has been obtained.  Description of procedure:  The patient was taken to the operating room and general anesthesia was induced.  The patient was placed in the dorsal lithotomy position, prepped and draped in the usual sterile fashion, and preoperative antibiotics were administered. A preoperative time-out was performed.   A 21 French scope was advanced per urethra into the bladder.  Attention was turned to the left ureteral orifice after the bladder was inspected and noted bili normal.  The left UO was intubated using a 5  [place] open-ended ureteral catheter.  Retrograde pyelogram on this side showed a single ureter, no evidence of duplex system although the collecting system was somewhat unusual with a complex upper pole system and a very thin stenotic infundibulum with minimal filling of the lower system.  There is no midpole infundibulum.  There is no bifurcation of the ureter.  I did not see a second ureteral orifice within the bladder.  I then advanced a sensor wire up to the level of the kidney.  I used a dual-lumen access sheath to introduce a second Super Stiff wire up to the kidney.  At 12/14 French ureteral access sheath was then advanced over the Super Stiff wire excluding the sensor wire up to level the proximal ureter which went quite easily under fluoroscopic guidance.  I then remove the inner cannula and the Super Stiff wire.  A digital flexible 8 French ureteroscope was then brought in and advanced to the level of the kidney.  I was able to easily evaluate the upper pole compound calyx which was unremarkable.  I then identified the lower pole infundibula however this was at a very sharp angle and somewhat anterior.  I injected more contrast to help identify this opening.  There was some slightly irregular tissue adjacent to this which ended up using per anus to biopsy.  Unfortunately, could not get through the infundibulum itself to biopsy or directly visualize within the dilated calyces.  I used several other techniques including fluoroscopically navigating, and later on advancing a wire through the stenotic infundibula which actually did appear to go and find its way into the dilated more superior calyx of this compound calyx.  Ultimately, in-depth using the same technique to introduce a cytology brush through the  stenotic infundibulum, try to agitate the mucosa and ultimately passed this off the field as urine cytology with the brush itself and saline.  I also aspirated and irrigated several times adjacent to the  opening of this infundibulum and provided this with a urine cytology as well is a selective cytology which was brushed.  Finally, I used a 200 m laser fiber using dusting settings to fulgurate the biopsy site for the purpose of achieving hemostasis.  Finally, I removed the scope inspecting the ureter along the way at the same time removing the access sheath.  There was no evidence of ureteral pathology or significant trauma.  A 6 x 22 French double-J ureteral stent was advanced over the wire up to the level of the kidney.  The wire was then removed until full "was noted within the bladder and the stent was looped over an upper pole calyx.  She was then cleaned and dried, repositioned in the supine position, reversed from anesthesia, taken to the PACU in stable condition.  Plan: Intraoperative details were shared with her family.  Unfortunately, was not able to access the lower pole and not able to treat the stone.  There is no obvious tumor although I was unable to directly visualize this particular area of concern.  Ultimately, after the pathology of the cytology and biopsy returned, if this is negative, I may counseled her to manage this conservatively, consideration of repeat imaging in 3 to 6 months especially given her age and limited treatment options if this is a malignancy.  Her family is agreeable this plan.  I will discuss this further with her in the office.  We will tentatively plan for possible stent removal in the office next week pending pathology.  Hollice Espy, M.D.

## 2022-06-26 NOTE — OR Nursing (Signed)
Blood sugar dropped again to 48. Dr. Danne Baxter notified and D50 given as ordered. Nurse anesthetis notified.

## 2022-06-26 NOTE — Anesthesia Preprocedure Evaluation (Signed)
Anesthesia Evaluation  Patient identified by MRN, date of birth, ID band Patient awake    Reviewed: Allergy & Precautions, H&P , NPO status , Patient's Chart, lab work & pertinent test results, reviewed documented beta blocker date and time   Airway Mallampati: II  TM Distance: >3 FB Neck ROM: full    Dental  (+) Teeth Intact   Pulmonary neg pulmonary ROS,    Pulmonary exam normal        Cardiovascular hypertension, On Medications Normal cardiovascular exam+ Valvular Problems/Murmurs  Rhythm:regular Rate:Normal     Neuro/Psych  Neuromuscular disease negative psych ROS   GI/Hepatic Neg liver ROS, GERD  Medicated,  Endo/Other  negative endocrine ROSdiabetes  Renal/GU negative Renal ROS  negative genitourinary   Musculoskeletal   Abdominal   Peds  Hematology negative hematology ROS (+)   Anesthesia Other Findings Past Medical History: No date: Aortic atherosclerosis (HCC) No date: Atypical chest pain No date: Diastolic dysfunction     Comment:  a.) TTE 01/29/2015: EF 45%, mild global HK, triv PR,               mild AR/MR/TR, G1DD; b.) TTE 05/13/2018: EF >55%, mild               panvalvular regurgitation; c.) TTE 02/08/2022: EF >55%,               mild LVH, mild AR/TR/PR, mod MR, G1DD No date: GERD (gastroesophageal reflux disease) No date: Gout No date: History of kidney stones No date: HLD (hyperlipidemia) No date: Hypertension No date: Long term current use of antithrombotics/antiplatelets     Comment:  a.) clopidogrel No date: Long term current use of immunosuppressive drug     Comment:  a.) hydroxycholorquine + prednisone No date: Murmur No date: Nephrolithiasis No date: Osteopenia of multiple sites No date: Peripheral edema No date: Peripheral neuropathy No date: Polymyalgia rheumatica syndrome (HCC) No date: Right adrenal mass (HCC) No date: T2DM (type 2 diabetes mellitus) ([place]) No date: Temporal  arteritis ([place]) Past Surgical History: 07/23/2019: ARTERY BIOPSY; Right     Comment:  Procedure: BIOPSY TEMPORAL ARTERY;  Surgeon: Benjamine Sprague, DO;  Location: [facility] ORS;  Service: General;                Laterality: Right; No date: SMALL INTESTINE SURGERY BMI    Body Mass Index: 21.14 kg/m     Reproductive/Obstetrics negative OB ROS                             Anesthesia Physical Anesthesia Plan  ASA: 3  Anesthesia Plan: General ETT   Post-op Pain Management:    Induction:   PONV Risk Score and Plan:   Airway Management Planned:   Additional Equipment:   Intra-op Plan:   Post-operative Plan:   Informed Consent: I have reviewed the patients History and Physical, chart, labs and discussed the procedure including the risks, benefits and alternatives for the proposed anesthesia with the patient or authorized representative who has indicated his/her understanding and acceptance.     Dental Advisory Given  Plan Discussed with: CRNA  Anesthesia Plan Comments:         Anesthesia Quick Evaluation

## 2022-06-26 NOTE — H&P (Signed)
06/26/22 RRR CTAB  Back on prednisone by rheum  Currently on abx for preop Ucx  Valerie Cochran June 20, 1933 [id number]   Referring provider:  Marinda Elk, [place] [facility] [place],  [place] [address] No chief complaint on file.         HPI: Valerie Cochran is a 88 y.o.female with a personal history of left kidney stone and right adrenal gland nodule, who presents today for a 6 month follow-up with CT results.    She underwent a CT abdomen and pevlis on 10/10/2021 during ED visit for abdominal pain. It visualized a focally obstructive left 1.3 cm nephrolithiasis (inferior pole left calyceal dilatation). An incidentally noted Coarsely calcified 3.9 x 2.4 cm right adrenal gland nodule   Follow-up CT on 04/07/2022 visualized stable diffusely calcified right adrenal mass, consistent with benign etiology. Duplicated left renal collecting system, with stable dilatation of lower pole moiety which contains a 13 mm calculus. Persistent focal focal urothelial contrast enhancement and narrowing at the left UPJ of the lower pole moiety. 13 mm nonobstructing left renal calculus.   She has a recent creatinine of 0.90 on 04/07/2022.    She denies any flank pain or urinary issues.   She is accompanied by her daughter today.     PMH:     Past Medical History:  Diagnosis Date   Diabetes mellitus without complication ([place])     Hypertension        Surgical History:      Past Surgical History:  Procedure Laterality Date   ARTERY BIOPSY Right 07/23/2019    Procedure: BIOPSY TEMPORAL ARTERY;  Surgeon: Benjamine Sprague, DO;  Location: [facility] ORS;  Service: General;  Laterality: Right;   SMALL INTESTINE SURGERY          Home Medications:  Allergies as of 04/26/2022         Reactions    Aspirin              Medication List           Accurate as of April 26, 2022 11:19 AM. If you have any questions, ask your nurse or doctor.              acetaminophen 500 MG  tablet Commonly known as: TYLENOL Take 2 tablets (1,000 mg total) by mouth every 8 (eight) hours as needed for mild pain, moderate pain, fever or headache.    clopidogrel 75 MG tablet Commonly known as: PLAVIX Take 75 mg by mouth daily.    cyclobenzaprine 10 MG tablet Commonly known as: FLEXERIL Take 10 mg by mouth 3 (three) times daily as needed for muscle spasms.    DULoxetine 30 MG capsule Commonly known as: CYMBALTA Take 30 mg by mouth daily.    fluticasone 50 MCG/ACT nasal spray Commonly known as: FLONASE Place 2 sprays into the nose daily.    gabapentin 300 MG capsule Commonly known as: NEURONTIN Take 300 mg by mouth at bedtime.    losartan 100 MG tablet Commonly known as: COZAAR Take 100 mg by mouth daily.    metFORMIN 500 MG 24 hr tablet Commonly known as: GLUCOPHAGE-XR Take 1,000 mg by mouth every evening.    mirtazapine 7.5 MG tablet Commonly known as: REMERON Take 7.5 mg by mouth every evening.    omeprazole 20 MG capsule Commonly known as: PRILOSEC Take 20 mg by mouth 2 (two) times daily.    ondansetron 4 MG disintegrating tablet Commonly known as:  ZOFRAN-ODT Take 1 tablet (4 mg total) by mouth every 8 (eight) hours as needed for nausea or vomiting.    potassium chloride SA 20 MEQ tablet Commonly known as: KLOR-CON M Take 20 mEq by mouth daily as needed.    rosuvastatin 5 MG tablet Commonly known as: CRESTOR Take 5 mg by mouth daily.    sulfamethoxazole-trimethoprim 800-160 MG tablet Commonly known as: BACTRIM DS Take 1 tablet by mouth 2 (two) times daily.    traMADol 50 MG tablet Commonly known as: Ultram Take 1 tablet (50 mg total) by mouth every 6 (six) hours as needed.    traZODone 50 MG tablet Commonly known as: DESYREL Take 50 mg by mouth at bedtime as needed for sleep.    triamcinolone cream 0.5 % Commonly known as: KENALOG Apply 1 application topically 2 (two) times daily.    Trulicity 3 LZ/7.6BH Sopn Generic drug:  Dulaglutide INJECT 0.5 MLS (3 MG TOTAL) SUBCUTANEOUSLY ONCE A WEEK             Allergies:      Allergies  Allergen Reactions   Aspirin        Family History:      Family History  Problem Relation Age of Onset   Breast cancer Neg Hx        Social History:  reports that she has never smoked. She has never used smokeless tobacco. She reports that she does not drink alcohol and does not use drugs.     Physical Exam: There were no vitals taken for this visit.  Constitutional:  Alert and oriented, No acute distress. HEENT: [place] AT, moist mucus membranes.  Trachea midline, no masses. Cardiovascular: No clubbing, cyanosis, or edema. Respiratory: Normal respiratory effort, no increased work of breathing. Skin: No rashes, bruises or suspicious lesions. Neurologic: Grossly intact, no focal deficits, moving all 4 extremities. Psychiatric: Normal mood and affect.   Laboratory Data: Recent Labs       Lab Results  Component Value Date    CREATININE 0.90 04/07/2022          Pertinent Imaging: CLINICAL DATA:  Follow-up indeterminate right adrenal mass. Nephrolithiasis.   EXAM: CT ABDOMEN WITHOUT AND WITH CONTRAST   TECHNIQUE: Multidetector CT imaging of the abdomen was performed following the standard protocol before and following the bolus administration of intravenous contrast.   RADIATION DOSE REDUCTION: This exam was performed according to the departmental dose-optimization program which includes automated exposure control, adjustment of the mA and/or kV according to patient size and/or use of iterative reconstruction technique.   CONTRAST:  159m OMNIPAQUE IOHEXOL 300 MG/ML  SOLN   COMPARISON:  10/09/2021   FINDINGS: Lower Chest: No acute findings.   Hepatobiliary: No hepatic masses identified. Gallbladder is unremarkable. No evidence of biliary ductal dilatation.   Pancreas:  No mass or inflammatory changes.   Spleen: Within normal limits in size and  appearance.   Adrenals/Urinary Tract: A diffusely calcified right adrenal mass is again seen measuring 3.7 x 2.4 cm. This remains stable since prior study. Contrast washout cannot be measured due to its diffuse calcification. Left adrenal gland remains normal in appearance.   Several small benign-appearing renal cysts are again seen bilaterally (no followup imaging recommended). No renal parenchymal masses identified.   13 mm calculus is again seen in a lower pole calyx of left kidney. Duplicated left renal collecting system is seen with dilatation of the lower pole moiety which is unchanged since recent exam. A focal area of urothelial  enhancement and narrowing is seen at the UPJ of the lower pole collecting system.(see image 78/15). This raises possibility of urothelial neoplasm.   Stomach/Bowel:  Unremarkable.   Vascular/Lymphatic: No pathologically enlarged lymph nodes. No acute vascular findings. Aortic atherosclerotic calcification incidentally noted.   Other:  None.   Musculoskeletal:  No suspicious bone lesions identified.   IMPRESSION: Stable diffusely calcified right adrenal mass, consistent with benign etiology.   Duplicated left renal collecting system, with stable dilatation of lower pole moiety which contains a 13 mm calculus.   Persistent focal focal urothelial contrast enhancement and narrowing at the left UPJ of the lower pole moiety. This raises possibility of urothelial neoplasm. Recommend correlation with urinalysis, and consider retrograde ureteroscopy for further evaluation.   13 mm nonobstructing left renal calculus.   Aortic Atherosclerosis (ICD10-I70.0).     Electronically Signed   By: Marlaine Hind M.D.   On: 04/08/2022 15:45     I have personally reviewed the images and agree with radiologist interpretation.    Assessment & Plan:     Focal urothelial contrast enhancement, left - Discussed conservative management versus surveillance  versus definitive diagnostic work-up.  Options including monitoring symptoms, interval surveillance, versus diagnostic ureteroscopy with possible biopsy were all discussed.   -After discussion about the risk and benefits along with her medical comorbidities, she actually has elected to want to pursue diagnostic ureteroscopy with possible biopsy.  She also like to have her stone treated at the same time.  We discussed ureteroscopy with laser lithotripsy possible biopsy and ureteral stent placement.  We discussed the risk including risk of bleeding, infection, damage surrounding structures, need for ureteral stent, and the possibility of need for staged procedure, approximately 10%. - Urine sent for cytology    2. Right adrenal gland nodule  - Stable on CT abdomen w/w/o contrast and not concerning at this time would not recommend further intervention /surveillance, likely benign   3. Left kidney stone  - Stable on CT abdomen w/w/o contrast  - She would like to proceed with ureteroscopy.  - We discussed the risk of ureteroscopy and that a stent will need to be placed.  - Urine sent for pre-op culture

## 2022-06-26 NOTE — Anesthesia Postprocedure Evaluation (Signed)
Anesthesia Post Note  Patient: Valerie Cochran  Procedure(s) Performed: CYSTOSCOPY/URETEROSCOPY/HOLMIUM LASER/STENT PLACEMENT (Left) CYSTOSCOPY WITH RENAL PELVIS BIOPSY (Left) CYSTOSCOPY WITH LASER FULGERATION OF RENAL PELVIS LESION (Left)  Patient location during evaluation: PACU Anesthesia Type: General Level of consciousness: awake and alert Pain management: pain level controlled Vital Signs Assessment: post-procedure vital signs reviewed and stable Respiratory status: spontaneous breathing, nonlabored ventilation, respiratory function stable and patient connected to nasal cannula oxygen Cardiovascular status: blood pressure returned to baseline and stable Postop Assessment: no apparent nausea or vomiting Anesthetic complications: no   No notable events documented.   Last Vitals:  Vitals:   06/26/22 1301 06/26/22 1315  BP: (!) 141/76 (!) 153/84  Pulse: 61 62  Resp: 14 10  Temp:  36.5 C  SpO2: 99% 97%    Last Pain:  Vitals:   06/26/22 1315  TempSrc:   PainSc: 0-No pain                 Ilene Qua

## 2022-06-26 NOTE — OR Nursing (Signed)
Patient arrived to [facility] with blood sugar of 53. Daughter reports she last had her metformin Saturday morning. Patient reports she hasn't eaten since yesterday at 9:30 am because she just hasn't had any appetite.Dr. Baruch Merl notified and glucose IV given as ordered.

## 2022-06-26 NOTE — Anesthesia Procedure Notes (Signed)
Procedure Name: Intubation Date/Time: 06/26/2022 11:31 AM  Performed by: Lorie Apley, CRNAPre-anesthesia Checklist: Patient identified, Patient being monitored, Timeout performed, Emergency Drugs available and Suction available Patient Re-evaluated:Patient Re-evaluated prior to induction Oxygen Delivery Method: Circle system utilized Preoxygenation: Pre-oxygenation with 100% oxygen Induction Type: IV induction Ventilation: Mask ventilation without difficulty Laryngoscope Size: Mac and 3 Grade View: Grade I Tube type: Oral Tube size: 7.0 mm Number of attempts: 1 Airway Equipment and Method: Stylet Placement Confirmation: ETT inserted through vocal cords under direct vision, positive ETCO2 and breath sounds checked- equal and bilateral Secured at: 20 cm Tube secured with: Tape Dental Injury: Teeth and Oropharynx as per pre-operative assessment

## 2022-06-26 NOTE — Transfer of Care (Signed)
Immediate Anesthesia Transfer of Care Note  Patient: Valerie Cochran  Procedure(s) Performed: CYSTOSCOPY/URETEROSCOPY/HOLMIUM LASER/STENT PLACEMENT (Left) CYSTOSCOPY WITH RENAL PELVIS BIOPSY (Left) CYSTOSCOPY WITH LASER FULGERATION OF RENAL PELVIS LESION (Left)  Patient Location: PACU  Anesthesia Type:General  Level of Consciousness: awake, alert  and oriented  Airway & Oxygen Therapy: Patient Spontanous Breathing and Patient connected to face mask oxygen  Post-op Assessment: Report given to RN, Post -op Vital signs reviewed and stable and Patient moving all extremities  Post vital signs: Reviewed and stable  Last Vitals:  Vitals Value Taken Time  BP    Temp    Pulse 59 06/26/22 1249  Resp 13 06/26/22 1249  SpO2 100 % 06/26/22 1249  Vitals shown include unvalidated device data.  Last Pain:  Vitals:   06/26/22 0827  TempSrc: Temporal  PainSc: 0-No pain         Complications: No notable events documented.

## 2022-06-26 NOTE — Discharge Instructions (Addendum)
You have a ureteral stent in place.  This is a tube that extends from your kidney to your bladder.  This may cause urinary bleeding, burning with urination, and urinary frequency.  Please call our office or present to the ED if you develop fevers >101 or pain which is not able to be controlled with oral pain medications.  You may be given either Flomax and/ or ditropan to help with bladder spasms and stent pain in addition to pain medications.    [facility] [address], [address] [place], [place] [address] [phone]  AMBULATORY SURGERY  DISCHARGE INSTRUCTIONS   The drugs that you were given will stay in your system until tomorrow so for the next 24 hours you should not:  Drive an automobile Make any legal decisions Drink any alcoholic beverage   You may resume regular meals tomorrow.  Today it is better to start with liquids and gradually work up to solid foods.  You may eat anything you prefer, but it is better to start with liquids, then soup and crackers, and gradually work up to solid foods.   Please notify your doctor immediately if you have any unusual bleeding, trouble breathing, redness and pain at the surgery site, drainage, fever, or pain not relieved by medication.    Additional Instructions:   Please contact your physician with any problems or Same Day Surgery at [phone], Monday through Friday 6 am to 4 pm, or [place] at  Main number at [phone].  

## 2022-06-27 ENCOUNTER — Encounter: Payer: Self-pay | Admitting: Urology

## 2022-06-27 LAB — SURGICAL PATHOLOGY

## 2022-06-28 LAB — CYTOLOGY - NON PAP

## 2022-07-05 ENCOUNTER — Ambulatory Visit (INDEPENDENT_AMBULATORY_CARE_PROVIDER_SITE_OTHER): Payer: Medicare Other | Admitting: Urology

## 2022-07-05 ENCOUNTER — Encounter: Payer: Self-pay | Admitting: Urology

## 2022-07-05 VITALS — BP 156/79 | HR 73 | Ht 67.0 in | Wt 135.0 lb

## 2022-07-05 DIAGNOSIS — N200 Calculus of kidney: Secondary | ICD-10-CM

## 2022-07-05 LAB — URINALYSIS, COMPLETE
Bilirubin, UA: NEGATIVE
Glucose, UA: NEGATIVE
Ketones, UA: NEGATIVE
Nitrite, UA: NEGATIVE
Specific Gravity, UA: 1.025 (ref 1.005–1.030)
Urobilinogen, Ur: 1 mg/dL (ref 0.2–1.0)
pH, UA: 5 (ref 5.0–7.5)

## 2022-07-05 LAB — MICROSCOPIC EXAMINATION
RBC, Urine: >30 /hpf — AB (ref 0–2)
WBC, UA: >30 /hpf — AB (ref 0–5)

## 2022-07-05 MED ORDER — CEPHALEXIN 250 MG PO CAPS
500.0000 mg | ORAL_CAPSULE | Freq: Once | ORAL | Status: AC
  Administered 2022-07-05: 500 mg via ORAL

## 2022-07-05 NOTE — Progress Notes (Signed)
   07/05/22  CC:  Chief Complaint  Patient presents with   Procedure    HPI: 88 year old female who presents today for cystoscopy and stent removal.  She was taken to the OR for left ureteroscopy for treatment of a left lower calyceal stone.  Incidental focal urothelial enhancement and narrowing seen at the level of the UPJ at the lower collecting system was noted on her preoperative CT scan.  Intraoperatively, there was no obvious evidence of malignancy.  Due to stenotic lower pole infundibulum, the left lower pole stone cannot be adequately treated.  Attempts were made at urine cytology which was acellular despite brush as well as biopsies in and around this location which did not show any evidence of malignancy.  Based on this discussion with the patient and her family, elected for stent removal today and serial imaging.  Blood pressure (!) 156/79, pulse 73, height '5\' 7"'$  (1.702 m), weight 135 lb (61.2 kg). NED. A&Ox3.   No respiratory distress   Abd soft, NT, ND Normal external genitalia with patent urethral meatus  Cystoscopy/ Stent removal procedure  Patient identification was confirmed, informed consent was obtained, and patient was prepped using Betadine solution.  Lidocaine jelly was administered per urethral meatus.    Preoperative abx where received prior to procedure.    Procedure: - Flexible cystoscope introduced, without any difficulty.   - Thorough search of the bladder revealed:    normal urethral meatus  Stent seen emanating from left ureteral orifice, grasped with stent graspers, and removed in entirety.     Post-Procedure: - Patient tolerated the procedure well  Periprocedural Keflex was given today as prophylaxis.   Assessment/ Plan:  1. Left nephrolithiasis Status post ureteroscopy, technically challenging/inability to treat extreme lower pole calyceal stone, will manage conservatively  Stent removed today  Reviewed warning symptoms  Frequency  and dysuria should improve, will send urine culture today as precaution although suspect is irritative.  She was given prophylactic antibiotics today. - CULTURE, URINE COMPREHENSIVE - cephALEXin (KEFLEX) capsule 500 mg - CT HEMATURIA WORKUP; Future  2. Kidney stones As above - Urinalysis, Complete - CULTURE, URINE COMPREHENSIVE - cephALEXin (KEFLEX) capsule 500 mg - CT HEMATURIA WORKUP; Future  Follow-up CT urogram in 6 months   Hollice Espy, MD

## 2022-07-08 LAB — CULTURE, URINE COMPREHENSIVE

## 2022-09-04 ENCOUNTER — Ambulatory Visit (LOCAL_COMMUNITY_HEALTH_CENTER): Payer: Medicare Other

## 2022-09-04 DIAGNOSIS — Z719 Counseling, unspecified: Secondary | ICD-10-CM

## 2022-09-04 DIAGNOSIS — Z23 Encounter for immunization: Secondary | ICD-10-CM | POA: Diagnosis not present

## 2022-09-04 NOTE — Progress Notes (Signed)
  Are you feeling sick today? No   Have you ever received a dose of COVID-19 Vaccine? [employer], [place], [place], [place], Other) Yes  If yes, which vaccine and how many doses?   Pfizer and 4 doses and Moderna one dose   Did you bring the vaccination record card or other documentation?  Yes   Do you have a health condition or are undergoing treatment that makes you moderately or severely immunocompromised? This would include, but not be limited to: cancer, HIV, organ transplant, immunosuppressive therapy/high-dose corticosteroids, or moderate/severe primary immunodeficiency.  No  pt states she takes a low dose prednisone  Have you received COVID-19 vaccine before or during hematopoietic cell transplant (HCT) or CAR-T-cell therapies? No  Have you ever had an allergic reaction to: (This would include a severe allergic reaction or a reaction that caused hives, swelling, or respiratory distress, including wheezing.) A component of a COVID-19 vaccine or a previous dose of COVID-19 vaccine? No   Have you ever had an allergic reaction to another vaccine (other thanCOVID-19 vaccine) or an injectable medication? (This would include a severe allergic reaction or a reaction that caused hives, swelling, or respiratory distress, including wheezing.)   No    Do you have a history of any of the following:  Myocarditis or Pericarditis No  Dermal fillers:  No  Multisystem Inflammatory Syndrome (MIS-C or MIS-A)? No  COVID-19 disease within the past 3 months? No  Vaccinated with monkeypox vaccine in the last 4 weeks? No  Covid Pfizer Comirnaty [phone] (12 yrs +) and High dose flu vaccine given and tolerated well. Updated [facility] copy and covid card given. Stayed for 15 min observation without problem. Josie Saunders, RN

## 2022-09-12 DIAGNOSIS — M353 Polymyalgia rheumatica: Secondary | ICD-10-CM | POA: Insufficient documentation

## 2023-01-03 ENCOUNTER — Ambulatory Visit: Payer: Medicare Other | Admitting: Urology

## 2023-01-03 ENCOUNTER — Ambulatory Visit
Admission: RE | Admit: 2023-01-03 | Discharge: 2023-01-03 | Disposition: A | Discharge status: Home / Self Care | Payer: Medicare Other | Source: Ambulatory Visit | Attending: Urology | Admitting: Urology

## 2023-01-03 DIAGNOSIS — N200 Calculus of kidney: Secondary | ICD-10-CM | POA: Insufficient documentation

## 2023-01-03 MED ORDER — IOHEXOL 300 MG/ML  SOLN
100.0000 mL | Freq: Once | INTRAMUSCULAR | Status: AC | PRN
  Administered 2023-01-03: 100 mL via INTRAVENOUS

## 2023-01-09 ENCOUNTER — Ambulatory Visit (INDEPENDENT_AMBULATORY_CARE_PROVIDER_SITE_OTHER): Payer: Medicare Other | Admitting: Urology

## 2023-01-09 VITALS — BP 162/78 | HR 80 | Ht 67.0 in | Wt 135.0 lb

## 2023-01-09 DIAGNOSIS — R319 Hematuria, unspecified: Secondary | ICD-10-CM | POA: Diagnosis not present

## 2023-01-09 DIAGNOSIS — R9341 Abnormal radiologic findings on diagnostic imaging of renal pelvis, ureter, or bladder: Secondary | ICD-10-CM | POA: Diagnosis not present

## 2023-01-09 DIAGNOSIS — N200 Calculus of kidney: Secondary | ICD-10-CM

## 2023-01-09 DIAGNOSIS — R8271 Bacteriuria: Secondary | ICD-10-CM | POA: Diagnosis not present

## 2023-01-09 NOTE — Progress Notes (Signed)
I, Amy L Pierron,acting as a scribe for Hollice Espy, MD.,have documented all relevant documentation on the behalf of Hollice Espy, MD,as directed by  Hollice Espy, MD while in the presence of Hollice Espy, MD.  01/09/2023 10:45 AM   [place] [birth year] [id number]  Referring provider: Marinda Elk, MD [facility] [place] [address]  Chief Complaint  Patient presents with   Follow-up    CT for Hematuria     HPI: 89 year-old female with a personal history of nephrolithiasis left collecting system duplication and possible left renal mass, returns today for six month follow-up with imaging.  She was taken to the OR on 06/26/2022 for left ureteroscopy for treatment of a left lower calyceal stone.  Incidental focal urothelial enhancement and narrowing seen at the level of the UPJ at the lower collecting system was noted on her preoperative CT scan.  Intraoperatively, there was no obvious evidence of malignancy.  Due to stenotic lower pole infundibulum, the left lower pole stone cannot be adequately treated.  Attempts were made at urine cytology which was acellular despite brush as well as biopsies in and around this location which did not show any evidence of malignancy. Based on this discussion with the patient and her family, elected for stent removal today and serial imaging.   She follows up today with ct urogram which shows persistent left lower pole stone burden, a dilated calyx, and some focal mucosal thickening versus enhancement. It's overall stable and unchanged since the last CT.   She is accompanied by a caregiver who mentions she fell a couple weeks ago and broke a toe. She was thought to have had a UTI and was give anti-biotics. However, she did not have any symptoms of one. Otherwise she is doing well overall and does not have any questions.  PMH: Past Medical History:  Diagnosis Date   Aortic atherosclerosis ([place])     Atypical chest pain    Diastolic dysfunction    a.) TTE 01/29/2015: EF 45%, mild global HK, triv PR, mild AR/MR/TR, G1DD; b.) TTE 05/13/2018: EF >55%, mild panvalvular regurgitation; c.) TTE 02/08/2022: EF >55%, mild LVH, mild AR/TR/PR, mod MR, G1DD   GERD (gastroesophageal reflux disease)    Gout    History of kidney stones    HLD (hyperlipidemia)    Hypertension    Long term current use of antithrombotics/antiplatelets    a.) clopidogrel   Long term current use of immunosuppressive drug    a.) hydroxycholorquine + prednisone   Murmur    Nephrolithiasis    Osteopenia of multiple sites    Peripheral edema    Peripheral neuropathy    Polymyalgia rheumatica syndrome ([place])    Right adrenal mass ([facility])    T2DM (type 2 diabetes mellitus) ([place])    Temporal arteritis ([place])     Surgical History: Past Surgical History:  Procedure Laterality Date   ARTERY BIOPSY Right 07/23/2019   Procedure: BIOPSY TEMPORAL ARTERY;  Surgeon: Benjamine Sprague, DO;  Location: [facility];  Service: General;  Laterality: Right;   CYSTOSCOPY WITH BIOPSY Left 06/26/2022   Procedure: CYSTOSCOPY WITH RENAL PELVIS BIOPSY;  Surgeon: Hollice Espy, MD;  Location: [facility] OR[facility];  Service: Urology;  Laterality: Left;   CYSTOSCOPY WITH FULGERATION Left 06/26/2022   Procedure: CYSTOSCOPY WITH LASER FULGERATION OF RENAL PELVIS LESION;  Surgeon: Hollice Espy, MD;  Location: [facility] OR[facility];  Service: Urology;  Laterality: Left;   CYSTOSCOPY/URETEROSCOPY/HOLMIUM LASER/STENT PLACEMENT Left 06/26/2022  Procedure: CYSTOSCOPY/URETEROSCOPY/HOLMIUM LASER/STENT PLACEMENT;  Surgeon: Hollice Espy, MD;  Location: [facility] OR[facility];  Service: Urology;  Laterality: Left;   SMALL INTESTINE SURGERY      Home Medications:  Allergies as of 01/09/2023       Reactions   Aspirin Hives, Itching        Medication List        Accurate as of January 09, 2023 10:45 AM. If you have any questions, ask your nurse or doctor.          STOP taking these  medications    Bactrim DS 800-160 MG tablet Generic drug: sulfamethoxazole-trimethoprim Stopped by: Hollice Espy, MD   cephALEXin 250 MG capsule Commonly known as: KEFLEX Stopped by: Hollice Espy, MD   sulfamethoxazole-trimethoprim 800-160 MG tablet Commonly known as: BACTRIM DS Stopped by: Hollice Espy, MD       TAKE these medications    alendronate 70 MG tablet Commonly known as: FOSAMAX Take 70 mg by mouth once a week. Take with a full glass of water on an empty stomach. Takes on Monday   clopidogrel 75 MG tablet Commonly known as: PLAVIX Take 75 mg by mouth daily.   cyclobenzaprine 10 MG tablet Commonly known as: FLEXERIL Take 10 mg by mouth 3 (three) times daily as needed for muscle spasms.   DULoxetine 30 MG capsule Commonly known as: CYMBALTA Take 30 mg by mouth daily.   fluticasone 50 MCG/ACT nasal spray Commonly known as: FLONASE Place 2 sprays into the nose daily.   furosemide 20 MG tablet Commonly known as: LASIX Take 20 mg by mouth daily.   gabapentin 100 MG capsule Commonly known as: NEURONTIN Take 100 mg by mouth at bedtime.   HYDROcodone-acetaminophen 5-325 MG tablet Commonly known as: NORCO/VICODIN Take 1-2 tablets by mouth every 6 (six) hours as needed for moderate pain.   hydroxychloroquine 200 MG tablet Commonly known as: PLAQUENIL Take 200 mg by mouth daily.   losartan 100 MG tablet Commonly known as: COZAAR Take 100 mg by mouth daily.   metFORMIN 500 MG 24 hr tablet Commonly known as: GLUCOPHAGE-XR Take 500-1,000 mg by mouth 2 (two) times daily. 1000 mg qam, 500 mg qpm   metoprolol succinate 50 MG 24 hr tablet Commonly known as: TOPROL-XL Take 50 mg by mouth daily.   mirtazapine 15 MG tablet Commonly known as: REMERON Take 15 mg by mouth at bedtime.   nitrofurantoin (macrocrystal-monohydrate) 100 MG capsule Commonly known as: MACROBID Take 1 capsule by mouth 2 (two) times daily.   omeprazole 20 MG capsule Commonly  known as: PRILOSEC Take 20 mg by mouth 2 (two) times daily.   oxybutynin 5 MG tablet Commonly known as: DITROPAN Take 1 tablet (5 mg total) by mouth every 8 (eight) hours as needed for bladder spasms.   potassium chloride SA 20 MEQ tablet Commonly known as: KLOR-CON M Take 20 mEq by mouth daily as needed.   rosuvastatin 5 MG tablet Commonly known as: CRESTOR Take 5 mg by mouth daily.   traZODone 50 MG tablet Commonly known as: DESYREL Take 50 mg by mouth at bedtime as needed for sleep.   triamcinolone cream 0.5 % Commonly known as: KENALOG Apply 1 application topically 2 (two) times daily.        Allergies:  Allergies  Allergen Reactions   Aspirin Hives and Itching    Family History: Family History  Problem Relation Age of Onset   Breast cancer Neg Hx     Social History:  reports  that she has never smoked. She has never used smokeless tobacco. She reports that she does not drink alcohol and does not use drugs.   Physical Exam: BP (!) 162/78   Pulse 80   Ht 5\' 7"  (1.702 m)   Wt 135 lb (61.2 kg)   BMI 21.14 kg/m   Constitutional:  Alert and oriented, No acute distress. HEENT: [place] AT, moist mucus membranes.  Trachea midline, no masses. Neurologic: Grossly intact, no focal deficits, moving all 4 extremities. Psychiatric: Normal mood and affect.  Pertinent Imaging: CT HEMATURIA WORKUP  Narrative CLINICAL DATA:  Kidney stones.  Renal lesion.  Recent UTI  EXAM: CT ABDOMEN AND PELVIS WITHOUT AND WITH CONTRAST  TECHNIQUE: Multidetector CT imaging of the abdomen and pelvis was performed following the standard protocol before and following the bolus administration of intravenous contrast.  RADIATION DOSE REDUCTION: This exam was performed according to the departmental dose-optimization program which includes automated exposure control, adjustment of the mA and/or kV according to patient size and/or use of iterative reconstruction technique.  CONTRAST:   12mL OMNIPAQUE IOHEXOL 300 MG/ML  SOLN  COMPARISON:  CT 04/07/2022 and older  FINDINGS: Lower chest: There is some linear opacity lung bases likely scar or atelectasis. No pleural effusion. Coronary artery calcifications are seen. There is some air along the right side of the heart, likely related to the IV. Small hiatal hernia.  Hepatobiliary: Fatty liver infiltration. No space-occupying liver lesion. Patent portal vein. Gallbladder is present.  Pancreas: Unremarkable. No pancreatic ductal dilatation or surrounding inflammatory changes.  Spleen: Normal in size without focal abnormality.  Adrenals/Urinary Tract: Left adrenal gland is preserved. There is a calcified lesion involving the right adrenal gland. Previously measuring 3.7 x 2.4 cm and today 3.8 by 2.7 cm, not significantly changed when adjusting for technique. Favor a benign process. Please correlate for any history of prior trauma.  Stable stone along the lower pole left kidney. This appears to be in the shape of the calyx. On image 36 of series 2 measuring 8 mm just above this and anterior second area measuring 9 mm in length. There is focal renal atrophy in this location. There is calyceal dilatation focally in this location with an additional filling defect suggested best seen on delayed coronal image 96 of series 20. There is some urothelial thickening as well. Underlying neoplasm or mass lesion is possible. Recommend further evaluation and correlation to history.  No right-sided stone. No separate enhancing renal mass. There is some benign appearing Bosniak 1 cysts in each kidney. Example medially in the right kidney on series 17, image 16 measuring 15 mm. Hounsfield unit of 15. Other Bosniak 1 and 2 lesions are identified. No specific follow-up of the cystic lesions.  The ureters have normal course and caliber down to the bladder. Preserved contours of the urinary bladder which is distended.  Stomach/Bowel:  Large bowel is of normal course and caliber with scattered stool. Moderate stool burden. The stomach and small bowel are nondilated.  Vascular/Lymphatic: Normal caliber aorta and IVC with scattered vascular calcifications. No specific abnormal lymph node enlargement seen in the abdomen and pelvis.  Reproductive: Uterus and bilateral adnexa are unremarkable.  Other: No abdominal wall hernia or abnormality. No abdominopelvic ascites.  Musculoskeletal: Mild degenerative changes seen along the spine. Osteopenia.  IMPRESSION: Persistent masslike thickening along left lower pole renal calyx and infundibulum with collecting system dilatation and secondary stone formation. Changes are worrisome for potential neoplasm.  Separate benign appearing Bosniak 1 and 2  renal cysts. No specific enhancing renal mass.  Bifid left renal collecting system.   Electronically Signed By: Jill Side M.D. On: 01/04/2023 12:29  Personally reviewed today and agree with radiologic interpretation.    Assessment & Plan:   Left urothelial abnormality/left kidney stones  - Will plan for another CT in about nine months.   - UA performed today to test for cancer cells. Awaiting results.  2. Asymptomatic Bacteriuria   - She grew Citrobacter on 01/01/2023. She's grown this on all urinalysis for multiple years and was asymptomatic.  -Recommend abstaining from antibiotics if she's not having any lower urinary tract symptoms specifically.  - Believe her fall is likely unrelated.  Return in about 9 months (around 10/11/2023) for CT scan.  [facility] [address] [place], [place] [address] [phone]

## 2023-01-10 LAB — CYTOLOGY - NON PAP

## 2023-10-09 ENCOUNTER — Ambulatory Visit
Admission: RE | Admit: 2023-10-09 | Discharge: 2023-10-09 | Disposition: A | Discharge status: Home / Self Care | Payer: Medicare Other | Source: Ambulatory Visit | Attending: Urology | Admitting: Urology

## 2023-10-09 DIAGNOSIS — R319 Hematuria, unspecified: Secondary | ICD-10-CM | POA: Insufficient documentation

## 2023-10-09 LAB — POCT I-STAT CREATININE: Creatinine, Ser: 1.5 mg/dL — ABNORMAL HIGH (ref 0.44–1.00)

## 2023-10-09 MED ORDER — IOHEXOL 300 MG/ML  SOLN
100.0000 mL | Freq: Once | INTRAMUSCULAR | Status: AC | PRN
  Administered 2023-10-09: 75 mL via INTRAVENOUS

## 2023-10-23 ENCOUNTER — Ambulatory Visit: Payer: Medicare Other | Admitting: Urology

## 2023-10-23 VITALS — BP 157/79 | HR 68 | Ht 67.0 in | Wt 162.0 lb

## 2023-10-23 DIAGNOSIS — R300 Dysuria: Secondary | ICD-10-CM

## 2023-10-23 DIAGNOSIS — Z91041 Radiographic dye allergy status: Secondary | ICD-10-CM

## 2023-10-23 DIAGNOSIS — I152 Hypertension secondary to endocrine disorders: Secondary | ICD-10-CM | POA: Insufficient documentation

## 2023-10-23 DIAGNOSIS — N898 Other specified noninflammatory disorders of vagina: Secondary | ICD-10-CM | POA: Diagnosis not present

## 2023-10-23 DIAGNOSIS — D4112 Neoplasm of uncertain behavior of left renal pelvis: Secondary | ICD-10-CM | POA: Diagnosis not present

## 2023-10-23 DIAGNOSIS — E1169 Type 2 diabetes mellitus with other specified complication: Secondary | ICD-10-CM | POA: Insufficient documentation

## 2023-10-23 DIAGNOSIS — R3129 Other microscopic hematuria: Secondary | ICD-10-CM

## 2023-10-23 DIAGNOSIS — E119 Type 2 diabetes mellitus without complications: Secondary | ICD-10-CM | POA: Insufficient documentation

## 2023-10-23 LAB — URINALYSIS, COMPLETE
Bilirubin, UA: NEGATIVE
Glucose, UA: NEGATIVE
Ketones, UA: NEGATIVE
Nitrite, UA: NEGATIVE
Specific Gravity, UA: >1.03 — ABNORMAL HIGH (ref 1.005–1.030)
Urobilinogen, Ur: 0.2 mg/dL (ref 0.2–1.0)
pH, UA: 5.5 (ref 5.0–7.5)

## 2023-10-23 LAB — MICROSCOPIC EXAMINATION
Epithelial Cells (non renal): >10 /[HPF] — AB (ref 0–10)
WBC, UA: >30 /[HPF] — AB (ref 0–5)

## 2023-10-23 MED ORDER — FLUCONAZOLE 150 MG PO TABS: ORAL_TABLET | ORAL | 1 refills | Status: DC

## 2023-10-23 MED ORDER — DIPHENHYDRAMINE HCL 50 MG PO TABS: ORAL_TABLET | ORAL | 0 refills | Status: DC

## 2023-10-23 MED ORDER — PREDNISONE 50 MG PO TABS: ORAL_TABLET | ORAL | 0 refills | Status: AC

## 2023-10-23 NOTE — Progress Notes (Signed)
 I,Amy L Pierron,acting as a scribe for Rosina Riis, MD.,have documented all relevant documentation on the behalf of Rosina Riis, MD,as directed by  Rosina Riis, MD while in the presence of Rosina Riis, MD.  10/23/2023 1:13 PM   Valerie Cochran Mt 25-Mar-1933 [id number]  Referring provider: Marikay Eva POUR, MD [address] [facility] [place],  [place] [address]  Chief Complaint  Patient presents with   Follow-up    HPI: 89 year-old female presents today for routine follow-up along with CT Urogram and urine cytology.   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 She underwent diagnostic ureteroscopy in 2023, at which time was technically unable to visualize the lower pole moiety due to severe infundibular stenosis. Selective cytology and brush was attempted at the time, however this was acellular. She followed up with interval imaging after electing conservative management.  CT Urogram on 10/09/2023 shows stable staghorn calculus in the left lower pole with overlying cortical atrophy. However, there's been interval progression with a more prominent filling defect in the lower pole calyces in the left kidney. Consistent with possible underlying urothelial carcinoma. She also has a calcified right adrenal mass; it's increased slightly in size. No adenopathy.   In addition to her age, medical comorbidities include aortic valve insufficiency, and diabetes.  She is accompanied today by her daughter who helps with her decision making.   She thinks she had an allergic reaction to the dye for the scan because her whole body was itching and she had a rash. This has since resolved.  She reports having burning with urination and then itching when she is done.  She has no flank pain or gross hematuria.   PMH: Past Medical History:  Diagnosis Date   Aortic atherosclerosis (HCC)    Atypical chest pain     Diastolic dysfunction    a.) TTE 01/29/2015: EF 45%, mild global HK, triv PR, mild AR/MR/TR, G1DD; b.) TTE 05/13/2018: EF >55%, mild panvalvular regurgitation; c.) TTE 02/08/2022: EF >55%, mild LVH, mild AR/TR/PR, mod MR, G1DD   GERD (gastroesophageal reflux disease)    Gout    History of kidney stones    HLD (hyperlipidemia)    Hypertension    Long term current use of antithrombotics/antiplatelets    a.) clopidogrel   Long term current use of immunosuppressive drug    a.) hydroxycholorquine + prednisone    Murmur    Nephrolithiasis    Osteopenia of multiple sites    Peripheral edema    Peripheral neuropathy    Polymyalgia rheumatica syndrome (HCC)    Right adrenal mass (HCC)    T2DM (type 2 diabetes mellitus) (HCC)    Temporal arteritis (HCC)     Surgical History: Past Surgical History:  Procedure Laterality Date   ARTERY BIOPSY Right 07/23/2019   Procedure: BIOPSY TEMPORAL ARTERY;  Surgeon: Tye Millet, DO;  Location: [facility];  Service: General;  Laterality: Right;   CYSTOSCOPY WITH BIOPSY Left 06/26/2022   Procedure: CYSTOSCOPY WITH RENAL PELVIS BIOPSY;  Surgeon: Riis Rosina, MD;  Location: [facility];  Service: Urology;  Laterality: Left;   CYSTOSCOPY WITH FULGERATION Left 06/26/2022   Procedure: CYSTOSCOPY WITH LASER FULGERATION OF RENAL PELVIS LESION;  Surgeon: Riis Rosina, MD;  Location: [facility];  Service: Urology;  Laterality: Left;   CYSTOSCOPY/URETEROSCOPY/HOLMIUM LASER/STENT PLACEMENT Left 06/26/2022   Procedure: CYSTOSCOPY/URETEROSCOPY/HOLMIUM LASER/STENT PLACEMENT;  Surgeon: Riis Rosina, MD;  Location: [facility];  Service: Urology;  Laterality:  Left;   SMALL INTESTINE SURGERY      Home Medications:  Allergies as of 10/23/2023       Reactions   Aspirin Hives, Itching   Ivp Dye [iodinated Contrast Media] Itching        Medication List        Accurate as of October 23, 2023  1:13 PM. If you have any questions, ask your nurse or doctor.           alendronate 70 MG tablet Commonly known as: FOSAMAX Take 70 mg by mouth once a week. Take with a full glass of water on an empty stomach. Takes on Monday   clopidogrel 75 MG tablet Commonly known as: PLAVIX Take 75 mg by mouth daily.   cyclobenzaprine 10 MG tablet Commonly known as: FLEXERIL Take 10 mg by mouth 3 (three) times daily as needed for muscle spasms.   diphenhydrAMINE  50 MG tablet Commonly known as: BENADRYL  Take 1 tablet 1 hour prior to the scan Started by: Rosina Riis   DULoxetine 30 MG capsule Commonly known as: CYMBALTA Take 30 mg by mouth daily.   fluconazole  150 MG tablet Commonly known as: DIFLUCAN  Take 1 tablet once and repeat 1 more as needed Started by: Rosina Riis   fluticasone 50 MCG/ACT nasal spray Commonly known as: FLONASE Place 2 sprays into the nose daily.   furosemide 20 MG tablet Commonly known as: LASIX Take 20 mg by mouth daily.   gabapentin 100 MG capsule Commonly known as: NEURONTIN Take 100 mg by mouth at bedtime.   HYDROcodone -acetaminophen  5-325 MG tablet Commonly known as: NORCO/VICODIN Take 1-2 tablets by mouth every 6 (six) hours as needed for moderate pain.   hydroxychloroquine 200 MG tablet Commonly known as: PLAQUENIL Take 200 mg by mouth daily.   losartan 100 MG tablet Commonly known as: COZAAR Take 100 mg by mouth daily.   metFORMIN 500 MG 24 hr tablet Commonly known as: GLUCOPHAGE-XR Take 500-1,000 mg by mouth 2 (two) times daily. 1000 mg qam, 500 mg qpm   metoprolol succinate 50 MG 24 hr tablet Commonly known as: TOPROL-XL Take 50 mg by mouth daily.   mirtazapine 15 MG tablet Commonly known as: REMERON Take 15 mg by mouth at bedtime.   omeprazole 20 MG capsule Commonly known as: PRILOSEC Take 20 mg by mouth 2 (two) times daily.   oxybutynin  5 MG tablet Commonly known as: DITROPAN  Take 1 tablet (5 mg total) by mouth every 8 (eight) hours as needed for bladder spasms.   potassium chloride SA  20 MEQ tablet Commonly known as: KLOR-CON M Take 20 mEq by mouth daily as needed.   predniSONE  50 MG tablet Commonly known as: DELTASONE  Take 1 tablet 13 hours prior to scan, then 7 hours prior and 1 hour prior Started by: Rosina Riis   rosuvastatin 5 MG tablet Commonly known as: CRESTOR Take 5 mg by mouth daily.   traZODone 50 MG tablet Commonly known as: DESYREL Take 50 mg by mouth at bedtime as needed for sleep.   triamcinolone cream 0.5 % Commonly known as: KENALOG Apply 1 application topically 2 (two) times daily.        Allergies:  Allergies  Allergen Reactions   Aspirin Hives and Itching   Ivp Dye [Iodinated Contrast Media] Itching    Family History: Family History  Problem Relation Age of Onset   Breast cancer Neg Hx     Social History:  reports that she has never smoked. She has  never used smokeless tobacco. She reports that she does not drink alcohol and does not use drugs.   Physical Exam: BP (!) 157/79   Pulse 68   Ht 5' 7 (1.702 m)   Wt 162 lb (73.5 kg)   BMI 25.37 kg/m   Constitutional:  Alert and oriented, No acute distress. HEENT: [place] AT, moist mucus membranes.  Trachea midline, no masses. Neurologic: Grossly intact, no focal deficits, moving all 4 extremities. Psychiatric: Normal mood and affect.   Urinalysis    Component Value Date/Time   COLORURINE STRAW (A) 10/10/2021 0427   APPEARANCEUR Hazy (A) 10/23/2023 0912   LABSPEC 1.021 10/10/2021 0427   PHURINE 6.0 10/10/2021 0427   GLUCOSEU Negative 10/23/2023 0912   HGBUR NEGATIVE 10/10/2021 0427   BILIRUBINUR Negative 10/23/2023 0912   KETONESUR 5 (A) 10/10/2021 0427   PROTEINUR 1+ (A) 10/23/2023 0912   PROTEINUR NEGATIVE 10/10/2021 0427   NITRITE Negative 10/23/2023 0912   NITRITE NEGATIVE 10/10/2021 0427   LEUKOCYTESUR 2+ (A) 10/23/2023 0912   LEUKOCYTESUR SMALL (A) 10/10/2021 0427    Lab Results  Component Value Date   LABMICR See below: 10/23/2023   WBCUA >30 (A)  10/23/2023   LABEPIT >10 (A) 10/23/2023   MUCUS Present (A) 10/23/2023   BACTERIA Many (A) 10/23/2023    Pertinent Imaging: Results for orders placed during the hospital encounter of 10/09/23  CT HEMATURIA WORKUP  Addendum 10/20/2023  6:33 AM ADDENDUM REPORT: 10/20/2023 06:31  ADDENDUM: As noted in the body of the report, the patient has calcified right adrenal mass has increased in size slightly in the interval. Close follow-up recommended as neoplasm is not excluded.  Electronically Signed By: Camellia Candle M.D. On: 10/20/2023 06:31  Narrative CLINICAL DATA:  Nephrolithiasis. Left collecting system duplication. Possible left renal mass.  EXAM: CT ABDOMEN AND PELVIS WITHOUT AND WITH CONTRAST  TECHNIQUE: Multidetector CT imaging of the abdomen and pelvis was performed following the standard protocol before and following the bolus administration of intravenous contrast.  RADIATION DOSE REDUCTION: This exam was performed according to the departmental dose-optimization program which includes automated exposure control, adjustment of the mA and/or kV according to patient size and/or use of iterative reconstruction technique.  CONTRAST:  75mL OMNIPAQUE  IOHEXOL  300 MG/ML  SOLN  COMPARISON:  01/03/2023  FINDINGS: Lower chest: Unremarkable.  Hepatobiliary: No suspicious focal abnormality within the liver parenchyma. Layering sludge or tiny stones noted in the gallbladder. No intrahepatic or extrahepatic biliary dilation.  Pancreas: No focal mass lesion. No dilatation of the main duct. No intraparenchymal cyst. No peripancreatic edema.  Spleen: No splenomegaly. No suspicious focal mass lesion.  Adrenals/Urinary Tract: Left adrenal gland unremarkable. Calcified right adrenal mass is 4.2 x 2.8 cm today compared to 3.8 x 2.7 cm previously.  Staghorn calculus lower pole left kidney is stable in the interval with overlying cortical atrophy, similar to prior.  Caliceal distension in this region is stable. No stones are seen in the right kidney. No ureteral or bladder stones.  Imaging after IV contrast administration shows a more prominent filling defect in the lower pole calices of the left kidney (see coronal images 62 and 63 of series 16 and axial image 32 of series 13).  Tiny hypodense lesions in both kidneys are too small to characterize but statistically most likely benign. Attention on follow-up recommended.  Neither ureter is completely opacified on postcontrast imaging but there is no evidence for ureteral wall thickening or mass lesion along the course of either ureter. Delayed  imaging of the bladder shows no focal wall thickening or mass lesion. A component of this filling defect may be related to non mixture of opacified urine or blood clot. Urothelial lesion not excluded.  Stomach/Bowel: Stomach is unremarkable. No gastric wall thickening. No evidence of outlet obstruction. Duodenum is normally positioned as is the ligament of Treitz. No small bowel wall thickening. No small bowel dilatation. The terminal ileum is normal. The appendix is not well visualized, but there is no edema or inflammation in the region of the cecal tip to suggest appendicitis. No gross colonic mass. No colonic wall thickening. Diverticular changes are noted in the left colon without evidence of diverticulitis.  Vascular/Lymphatic: There is moderate atherosclerotic calcification of the abdominal aorta without aneurysm. There is no gastrohepatic or hepatoduodenal ligament lymphadenopathy. No retroperitoneal or mesenteric lymphadenopathy. No pelvic sidewall lymphadenopathy.  Reproductive: There is no adnexal mass.  Other: No intraperitoneal free fluid.  Musculoskeletal: No worrisome lytic or sclerotic osseous abnormality.  IMPRESSION: 1. Stable staghorn calculus lower pole left kidney with overlying cortical atrophy, similar to prior. 2. Interval  progression of a now more prominent filling defect in the lower pole calices of the left kidney. A component of this filling defect may be related to non mixture of unopacified urine or blood clot. Urothelial lesion remains a concern. 3. Layering sludge or tiny stones in the gallbladder. 4. Left colonic diverticulosis without diverticulitis. 5.  Aortic Atherosclerosis (ICD10-I70.0).  Electronically Signed: By: Camellia Candle M.D. On: 10/20/2023 06:19 Personally reviewed the above images and agree with radiologic interpretation.   Assessment & Plan:    1. Renal mass  - Slight increase in size over the past year. Reviewed options of treatment including continue watching, full body PET scan, another biopsy, or even a second opinion. She is currently asymptomatic and has other medical concerns so she and her daughter agree with continue monitoring with repeat imaging in 6 months. -Revisited goals of care discussion today.  She is not interested in returning to surgery both from a diagnostic as well as therapeutic standpoint.  As such, we will continue to manage with a more conservative approach.  - CT urogram in 6 months with pre-treatment due to itching and rash.   2. Vaginal itching  - Diflucan  150 repeat as needed once for vaginal itching.  Urine culture sent and awaiting final result.   Return in about 6 months (around 04/21/2024) for ct urogram. (Premedicate for allergy)  I have reviewed the above documentation for accuracy and completeness, and I agree with the above.   Rosina Riis, MD  [facility] [address] [place], [place] [address] [phone]

## 2023-10-26 LAB — CULTURE, URINE COMPREHENSIVE

## 2023-10-29 ENCOUNTER — Telehealth: Payer: Self-pay | Admitting: *Deleted

## 2023-10-29 ENCOUNTER — Encounter: Payer: Self-pay | Admitting: Urology

## 2023-10-29 MED ORDER — SULFAMETHOXAZOLE-TRIMETHOPRIM 800-160 MG PO TABS: 1.0000 | ORAL_TABLET | Freq: Two times a day (BID) | ORAL | 0 refills | Status: AC

## 2023-10-29 NOTE — Telephone Encounter (Signed)
Pt daughter calling triage asking for UA/UCX results, please advise

## 2023-10-29 NOTE — Telephone Encounter (Signed)
See my chart message

## 2023-10-29 NOTE — Telephone Encounter (Signed)
Toniann Fail, patient's daughter advised and medication sent in.   Sending to Dr Apolinar Junes to review cytology results.

## 2024-04-23 ENCOUNTER — Telehealth: Payer: Self-pay

## 2024-04-23 ENCOUNTER — Other Ambulatory Visit: Payer: Self-pay | Admitting: Urology

## 2024-04-23 ENCOUNTER — Ambulatory Visit
Admission: RE | Admit: 2024-04-23 | Discharge: 2024-04-23 | Disposition: A | Discharge status: Home / Self Care | Source: Ambulatory Visit | Attending: Urology | Admitting: Urology

## 2024-04-23 DIAGNOSIS — R3129 Other microscopic hematuria: Secondary | ICD-10-CM

## 2024-04-23 DIAGNOSIS — N898 Other specified noninflammatory disorders of vagina: Secondary | ICD-10-CM

## 2024-04-23 DIAGNOSIS — D4112 Neoplasm of uncertain behavior of left renal pelvis: Secondary | ICD-10-CM

## 2024-04-23 MED ORDER — DIPHENHYDRAMINE HCL 50 MG PO TABS: ORAL_TABLET | ORAL | 0 refills | Status: DC

## 2024-04-23 MED ORDER — PREDNISONE 50 MG PO TABS: ORAL_TABLET | ORAL | 0 refills | Status: DC

## 2024-04-23 NOTE — Telephone Encounter (Signed)
 LM informing pt that her CT has been r/s to 05/02/24 at 9am.   She is to arrive at 845. Prep meds have been sent to pharmacy. No food 4 hr prior.   Advised to contact office with questions.

## 2024-04-30 ENCOUNTER — Ambulatory Visit: Payer: Self-pay | Admitting: Urology

## 2024-04-30 ENCOUNTER — Telehealth: Payer: Self-pay

## 2024-04-30 NOTE — Telephone Encounter (Signed)
 Pt's daughter, wendy LVM on the triage line stating she wanted us  to send pt an antibiotic. UA was ran with [facility] clinic.   Left detailed VM per DPR for pt's daughter informing her that they did a UA with their PCP at [facility] and that I would recommend reaching out to them so they can treat you based on those results

## 2024-05-02 ENCOUNTER — Ambulatory Visit
Admission: RE | Admit: 2024-05-02 | Discharge: 2024-05-02 | Disposition: A | Discharge status: Home / Self Care | Source: Ambulatory Visit | Attending: Urology | Admitting: Urology

## 2024-05-02 DIAGNOSIS — N898 Other specified noninflammatory disorders of vagina: Secondary | ICD-10-CM | POA: Insufficient documentation

## 2024-05-02 DIAGNOSIS — R3129 Other microscopic hematuria: Secondary | ICD-10-CM | POA: Diagnosis present

## 2024-05-02 DIAGNOSIS — D4112 Neoplasm of uncertain behavior of left renal pelvis: Secondary | ICD-10-CM | POA: Insufficient documentation

## 2024-05-02 LAB — POCT I-STAT CREATININE: Creatinine, Ser: 1.7 mg/dL — ABNORMAL HIGH (ref 0.44–1.00)

## 2024-05-02 MED ORDER — IOHEXOL 300 MG/ML  SOLN
100.0000 mL | Freq: Once | INTRAMUSCULAR | Status: AC | PRN
  Administered 2024-05-02: 100 mL via INTRAVENOUS

## 2024-05-05 ENCOUNTER — Other Ambulatory Visit: Payer: Self-pay | Admitting: Physician Assistant

## 2024-05-05 DIAGNOSIS — Z1231 Encounter for screening mammogram for malignant neoplasm of breast: Secondary | ICD-10-CM

## 2024-05-05 NOTE — Progress Notes (Signed)
 QUICK REFERENCE INFORMATION: CMS.gov Medicare Wellness Visits  Medicare Annual Wellness Visit  Chief Complaint  Patient presents with  . Annual Exam    Physical and AWV  . Blood Sugar Problem    Recently on 3 rounds of prednisone . BS has been elevated over 500 recently. Unable to have her CT the last 3 times she went bc of her blood sugar.     Subjective:   Valerie Cochran is a 89 y.o. Female who presents for an Annual Wellness Visit. Additional concerns addressed today include: History of Present Illness An 89 year old female with chronic kidney disease who presents for annual exam and AWV  She has been unable to complete a CT scan to evaluate urothelial and kidney lesions due to concerns about her kidney function. Two scheduled attempts in one week were unsuccessful; the first was canceled because prednisone  was not administered, and the second was halted due to unsuitable kidney function for contrast dye. Her creatinine level is 1.6, and her glomerular filtration rate is 31.  She experiences a burning sensation during urination, suspecting a urinary tract infection. This symptom coincides with elevated blood sugar levels, which she attributes to recent prednisone  use. Her hemoglobin A1c is 7.7.  Her current medications include prednisone  2.5 mg every other day for polymyalgia rheumatica, with no current symptoms such as body aches or shoulder pain. She also takes hydroxychloroquine for arthritis and PMR.  She has a history of allergies to aspirin and contrast dye, which have caused her to turn red and experience itching. She is not currently on any sulfa  drugs.  Socially, she lives independently, does not use a cane or walker, and manages her daily activities without assistance. She drives herself and is active in her daily life, including climbing stairs regularly.  Patient Active Problem List  Diagnosis  . Hyperlipidemia associated with type 2 diabetes mellitus , unspecified (CMS-HCC)   . Hypertension associated with diabetes (CMS-HCC)  . Type 2 diabetes mellitus without complication, without long-term current use of insulin (CMS-HCC)  . Primary insomnia  . Earache on right  . Elevated erythrocyte sedimentation rate  . Temporal arteritis (CMS/HHS-HCC)  . Long term current use of systemic steroids  . Osteopenia of multiple sites  . Hypertension, essential  . Fall at home, initial encounter  . Gastroesophageal reflux disease without esophagitis  . PMR (polymyalgia rheumatica) (CMS/HHS-HCC)     Current Outpatient Medications:  .  acetaminophen  (TYLENOL  8 HOUR ORAL), Take by mouth as needed, Disp: , Rfl:  .  alendronate (FOSAMAX) 70 MG tablet, Take 1 tablet (70 mg total) by mouth every 7 (seven) days Take with a full glass of water. Do not lie down for the next 30 min., Disp: 12 tablet, Rfl: 1 .  dorzolamide (TRUSOPT) 2 % ophthalmic solution, Place 1 drop into both eyes 2 (two) times daily as needed, Disp: , Rfl:  .  dulaglutide (TRULICITY) 1.5 mg/0.5 mL subcutaneous pen injector, Inject 0.5 mLs (1.5 mg total) subcutaneously every 7 (seven) days, Disp: 2 mL, Rfl: 12 .  fluticasone (FLONASE) 50 mcg/actuation nasal spray, Place 2 sprays into both nostrils once daily as needed, Disp: , Rfl:  .  FUROsemide (LASIX) 20 MG tablet, Take 1 tablet (20 mg total) by mouth once daily, Disp: 90 tablet, Rfl: 3 .  gabapentin (NEURONTIN) 100 MG capsule, Take 1 capsule (100 mg total) by mouth at bedtime, Disp: 90 capsule, Rfl: 1 .  glipiZIDE (GLUCOTROL) 2.5 MG XL tablet, TAKE 1 TABLET BY MOUTH  EVERY DAY, Disp: 90 tablet, Rfl: 3 .  hydroxychloroquine (PLAQUENIL) 200 mg tablet, Take 1 tablet (200 mg total) by mouth once daily, Disp: 90 tablet, Rfl: 1 .  latanoprost (XALATAN) 0.005 % ophthalmic solution, as needed, Disp: , Rfl:  .  losartan (COZAAR) 100 MG tablet, Take 1 tablet (100 mg total) by mouth once daily, Disp: 90 tablet, Rfl: 3 .  metoprolol succinate (TOPROL-XL) 50 MG XL tablet, Take 1  tablet (50 mg total) by mouth once daily, Disp: 90 tablet, Rfl: 3 .  mirtazapine (REMERON) 15 MG tablet, TAKE 1 TABLET BY MOUTH EVERYDAY AT BEDTIME, Disp: 90 tablet, Rfl: 1 .  potassium chloride (K-DUR,KLOR-CON) 20 MEQ ER tablet, Take 20 mEq by mouth continuously as needed., Disp: , Rfl:  .  predniSONE  (DELTASONE ) 2.5 MG tablet, Take 1 tablet (2.5 mg total) by mouth every other day, Disp: 90 tablet, Rfl: 1 .  rosuvastatin (CRESTOR) 5 MG tablet, Take 1 tablet (5 mg total) by mouth once daily, Disp: 90 tablet, Rfl: 3 .  traZODone (DESYREL) 50 MG tablet, Take 50 mg by mouth nightly as needed for Sleep., Disp: , Rfl:  .  triamcinolone 0.5 % ointment, Apply topically 2 (two) times daily, Disp: 30 g, Rfl: 2 .  VITAMIN D3 ORAL, Take 1 tablet by mouth once daily, Disp: , Rfl:  .  cephalexin  (KEFLEX ) 250 MG capsule, Take 1 capsule (250 mg total) by mouth 3 (three) times daily for 7 days, Disp: 21 capsule, Rfl: 0 .  ONETOUCH DELICA LANCETS 33 gauge Misc, CHECK BLOOD SUGAR 3 TIMES A DAY, Disp: 100 each, Rfl: 5 .  ONETOUCH ULTRA TEST test strip, USE 1 TEST STRIP ONCE DAILY, Disp: 100 strip, Rfl: 11 .  pen needle, diabetic (BD NANO 2ND GEN PEN NEEDLE) 32 gauge x 5/32 Ndle, Use once daily; E11.9, Disp: 100 each, Rfl: 3 .  predniSONE  (DELTASONE ) 20 MG tablet, Take 40mg  x 3 days, 20mg  x 3 days, 10mg  x 3 days. (Patient not taking: Reported on 05/05/2024), Disp: 12 tablet, Rfl: 0 .  zolpidem (AMBIEN) 10 mg tablet, Take 1 tablet (10 mg total) by mouth at bedtime as needed for Sleep for up to 360 days (Patient not taking: Reported on 05/05/2024), Disp: 30 tablet, Rfl: 3        05/05/2024  [employer] Authorization for Release of Information - [employer]   Are any of your needs urgent? No  Would you like help with any of the needs that you have identified? No    Family History  Problem Relation Age of Onset  . Cancer Sister   . Arthritis Mother   . Gout Mother   . Heart failure Mother   . No Known Problems Father    . Diabetes Brother   . Alcohol abuse Brother   . Liver disease Brother   . No Known Problems Maternal Grandmother   . No Known Problems Maternal Grandfather   . No Known Problems Paternal Grandmother   . No Known Problems Paternal Grandfather   . Asthma Sister   . Arthritis Sister   . COPD Sister   . COPD Sister   . Heart disease Brother   . Diabetes Brother   . Prostate cancer Brother   . No Known Problems Brother   . Heart disease Brother   . Cancer Brother   . Liver disease Brother   . Alcohol abuse Brother   . Other Brother        brain tumor  .  Alcohol abuse Brother   . Arthritis Daughter   . Carpal tunnel syndrome Daughter        right  . Irritable bowel syndrome Daughter        c  . Scleroderma Daughter   . Reflux disease Daughter   . Neck pain Daughter   . Lupus Other      Past Medical History:  Diagnosis Date  . Diabetes mellitus type 2, uncomplicated (CMS/HHS-HCC)   . GERD (gastroesophageal reflux disease)   . Gout   . Hyperlipidemia   . Hypertension   . Polymyalgia (CMS/HHS-HCC)      Current Medical Providers and Suppliers: [facility] Patient Care Team: Marikay Eva Hausen, [place] as PCP - General (Internal Medicine) Future Appointments     Date/Time Provider Department Center Visit Type   08/11/2024 8:30 AM Callwood, Cara Endow, MD [facility] C FOLLOW UP   08/11/2024 11:15 AM Cherilyn Debby Quivers, MD [facility] [place] CLI RETURN VISIT   08/19/2024 1:45 PM Tobie Lady Plumb, MD [facility] C RETURN VISIT   10/29/2024 8:30 AM CHRISTOBAL [facility] LAB [facility] LAB   11/05/2024 10:30 AM Marikay Eva Hausen, PA [facility]ARYL BR[facility] OFFICE VISIT      dentist eye doctor Endocrinology, urology, rheumatology, cardiology.  Age-appropriate Screening Schedule: The list below includes current immunization status and future screening recommendations based on patient's age. Orders for  these recommended tests are listed in the plan section. The patient has been provided with a written plan. Immunization History  Administered Date(s) Administered  . COVID-19 Moderna Vaccine (1st,2nd,3rd dose = 0.62ml) 04/19/2021  . COVID-19 Pfizer Monovalent Vaccine 01/03/2020, 01/27/2020, 09/26/2020  . COVID-19 vaccine >12 years (Pfizer-Biontech, Bivalent) IM Injection 30 mcg/0.3mL 12/29/2021  . Covid-19 Vaccine 28mcg/0.3ml (>=56yrs)Pfizer-Biontech 09/04/2022  . Flu Vaccine HD-IIV3 High Dose, IM PF(88yo+)(Fluzone) 06/07/2018, 09/04/2022  . Influenza IIV4, High Dose IM (65 Yr+) (FLUZONE QUAD) 07/23/2020, 08/15/2021  . PNEUMOCOCCAL (PCV13) (BIRTH-72YR) VACCINE (PREVNAR 13) 10/31/2017  . PNEUMOCOCCAL (PPSV23)(>=22YRS -OR- >=2 YRS WITH RISK) VACCINE (PNEUMOVAX 23) 04/25/2019  . RZV(>=14YR -OR-19+YRS IF  IMMCOMP) VACCINE (SHINGRIX) 10/31/2017, 05/08/2018  . TDAP (>=15YR) VACCINE (ADACEL/BOOSTRIX) 01/25/2021    Health Maintenance Topics with due status: Overdue     Topic Date Due   RSV Immunization Pregnant or 60+ Never done   Mammogram 11/26/2018   COVID-19 Vaccine 06/10/2023   Health Maintenance Topics with due status: Not Due     Topic Last Completion Date   Adult Tetanus (Td And Tdap) 01/25/2021   DXA Bone Density Scan 10/28/2021   Influenza Vaccine 09/04/2022   Monofilament Foot Exam 08/13/2023   Creatinine Level 04/28/2024   Potassium Level 04/28/2024   Lipid Panel 04/28/2024   Hemoglobin A1C 04/28/2024   Serum Calcium 04/28/2024   Diabetes Eye Assessment Exam 05/05/2024   Medicare Subsequent AWV [id number] 05/05/2024   Diabetes Education 05/05/2024   Depression Screening 05/05/2024   Health Maintenance Topics with due status: Completed     Topic Last Completion Date   Shingrix 05/08/2018   Pneumococcal Vaccine: 50+ 04/25/2019   Health Maintenance Topics with due status: Aged Out     Topic Date Due   Hib Vaccines Aged Out   Hepatitis A Vaccines Aged Out   Meningococcal B  Vaccine Aged Out   Meningococcal ACWY Vaccine Aged Out   HPV Vaccines Aged Out   Health Maintenance Topics with due status: Discontinued     Topic Date Due   Annual Urine  Albumin Creatinine Ratio Discontinued    Depression Screen-PHQ2/9 completed today  PHQ-2 Over the past 2 weeks, how often have you been bothered by any of the following problems? Little interest or pleasure in doing things: Not at all Feeling down, depressed, or hopeless: Not at all Patient Health Questionnaire-2 Score: 0 PHQ-2 Over the last 2 weeks, how often have you been bothered by any of the following problems? Little interest or pleasure in doing things: Not at all Feeling down, depressed, or hopeless: Not at all Patient Health Questionnaire-2 Score: 0  PHQ-9 (if PHQ >=3)    PHQ-2 Interpretation Values between 0-3 are considered not significant for depression  PHQ-9 Interpretation and Treatment Recommendations:  0-4= None  5-9= Mild / Treatment: Support, educate to call if worse; return in one month  10-14= Moderate / Treatment: Support, watchful waiting; Antidepressant or Psychotherapy  15-19= Moderately severe / Treatment: Antidepressant OR Psychotherapy  >= 20 = Major depression, severe / Antidepressant AND Psychotherapy  Medicare Depression Screening documentation: Medicare patients only: Use the following smartphrase below to document any depression screening  done for => 5 minutes (do not use if they have existing depression or anxiety, use [id number] and the depression or anxiety diagnosis code)  Patient Health Risk Assessment questionnaire (HRA <redacted file path>): (completed in MyChart or added in flowsheet)    * No data to display         Recent Hospitalizations? No  Functional Ability/Safety Screen: 1. Was the patient's timed Get Up and Go Test unsteady or longer than 30 sec? No  How to perform Timed Up and Go test (TUG): [email or web address].castaneda.[email or web address]  2. Does the  patient have difficulty with: Shaune index)  Bathing: No   Dressing: No Toileting: No   Transferring: No  Continence: No  Feeding: No   3. Does the home have: lives alone. Rugs in the hallway: No Grab bars in the bathroom: Yes Stairs in home: Yes Handrails on the stairs: yes Poor lighting: No No cane or walker, no falls.  Hearing Evaluation: Does the patient have trouble hearing the television when others do not? Yes Does the patient strain to hear/understand conversations? Yes  Screening for drug use: How many times in the past year has patient used an illegal drug or used a prescription medication for non-medical reasons? 0 (>=1 is positive) Has the patient used opioid medication within the last year? No  Social History   Tobacco Use  . Smoking status: Never    Passive exposure: Never  . Smokeless tobacco: Never  Vaping Use  . Vaping status: Never Used  Substance Use Topics  . Alcohol use: No    Alcohol/week: 0.0 standard drinks of alcohol  . Drug use: No    Advance Care Planning: Patient has executed an Advance Directive: yes  If no, patient was given the opportunity to execute an Advance Directive today? no  Are the patient's advanced directives in [place]? no This patient has the ability to prepare an Advance Directive: Yes Provider is willing to follow the patient's wishes: Yes  Cognitive Assessment: Cognitive screen used: Mini cog. Results normal Results: The patient does not have any evidence of any cognitive problems and denies any change in mood/affect, appearance, speech, memory or motor skills.  Identification of Risk Factors: Risk factors include: none  Review of Systems  Objective:   Vitals:   05/05/24 0956  BP: (!) 160/88  Pulse: 68  SpO2: 100%  Weight: 78.2 kg (172 lb 6.4 oz)  Height:  167.6 cm (5' 6)  PainSc: 0-No pain   Body mass index is 27.83 kg/m. Home vitals:    Physical Exam  Physical Exam Wt Readings from Last 3  Encounters:  05/05/24 78.2 kg (172 lb 6.4 oz)  04/15/24 77.1 kg (169 lb 15.6 oz)  02/11/24 77.1 kg (170 lb)   Constitutional: alert and in NAD Conversation: normal and appropriate HEENT: EOMI, external ear canals normal, tympanic membranes normal, and pupils equal and round Skin: normal Oropharynx: good dentition, dentures, mucous membranes moist, posterior pharynx clear Neck: supple and no thyroid enlargement or cervical adenopathy Respiratory: clear to auscultation, without rales or wheezes Cardiovascular: regular rate and rhythm and without murmurs, rubs or gallops Abdomen: soft, nontender, nondistended, and no hepatosplenomegaly Musculoskeletal: age appropriate ROM of shoulders, hips and knees Extremities: no lower extremity edema, dorsalis pedis pulses normal Neurological: grossly intact and normal gait  Results Appointment on 04/28/2024  Component Date Value Ref Range Status  . WBC (White Blood Cell Count) 04/28/2024 4.4  4.1 - 10.2 10^3/uL Final  . RBC (Red Blood Cell Count) 04/28/2024 4.22  4.04 - 5.48 10^6/uL Final  . Hemoglobin 04/28/2024 12.8  12.0 - 15.0 gm/dL Final  . Hematocrit 92/78/7974 40.3  35.0 - 47.0 % Final  . MCV (Mean Corpuscular Volume) 04/28/2024 95.5  80.0 - 100.0 fl Final  . MCH (Mean Corpuscular Hemoglobin) 04/28/2024 30.3  27.0 - 31.2 pg Final  . MCHC (Mean Corpuscular Hemoglobin * 04/28/2024 31.8 (L)  32.0 - 36.0 gm/dL Final  . Platelet Count 04/28/2024 252  150 - 450 10^3/uL Final  . RDW-CV (Red Cell Distribution Widt* 04/28/2024 13.7  11.6 - 14.8 % Final  . MPV (Mean Platelet Volume) 04/28/2024 11.0  9.4 - 12.4 fl Final  . Neutrophils 04/28/2024 1.68  1.50 - 7.80 10^3/uL Final  . Lymphocytes 04/28/2024 2.15  1.00 - 3.60 10^3/uL Final  . Monocytes 04/28/2024 0.40  0.00 - 1.50 10^3/uL Final  . Eosinophils 04/28/2024 0.10  0.00 - 0.55 10^3/uL Final  . Basophils 04/28/2024 0.03  0.00 - 0.09 10^3/uL Final  . Neutrophil % 04/28/2024 38.4  32.0 - 70.0 %  Final  . Lymphocyte % 04/28/2024 49.2  10.0 - 50.0 % Final  . Monocyte % 04/28/2024 9.2  4.0 - 13.0 % Final  . Eosinophil % 04/28/2024 2.3  1.0 - 5.0 % Final  . Basophil% 04/28/2024 0.7  0.0 - 2.0 % Final  . Immature Granulocyte % 04/28/2024 0.2  <=0.7 % Final  . Immature Granulocyte Count 04/28/2024 0.01  <=0.06 10^3/L Final  . Glucose 04/28/2024 197 (H)  70 - 110 mg/dL Final  . Sodium 92/78/7974 141  136 - 145 mmol/L Final  . Potassium 04/28/2024 4.9  3.6 - 5.1 mmol/L Final  . Chloride 04/28/2024 105  97 - 109 mmol/L Final  . Carbon Dioxide (CO2) 04/28/2024 25.0  22.0 - 32.0 mmol/L Final  . Urea Nitrogen (BUN) 04/28/2024 31 (H)  7 - 25 mg/dL Final  . Creatinine 92/78/7974 1.6 (H)  0.6 - 1.1 mg/dL Final  . Glomerular Filtration Rate (eGFR) 04/28/2024 31 (L)  >60 mL/min/1.73sq m Final  . Calcium 04/28/2024 9.6  8.7 - 10.3 mg/dL Final  . AST  92/78/7974 29  8 - 39 U/L Final  . ALT  04/28/2024 30  5 - 38 U/L Final  . Alk Phos (alkaline Phosphatase) 04/28/2024 127 (H)  34 - 104 U/L Final  . Albumin 04/28/2024 4.1  3.5 - 4.8 g/dL Final  . Bilirubin, Total  04/28/2024 0.4  0.3 - 1.2 mg/dL Final  . Protein, Total 04/28/2024 6.4  6.1 - 7.9 g/dL Final  . A/G Ratio 92/78/7974 1.8  1.0 - 5.0 gm/dL Final  . Cholesterol, Total 04/28/2024 163  100 - 200 mg/dL Final  . Triglyceride 92/78/7974 113  35 - 199 mg/dL Final  . HDL (High Density Lipoprotein) Cho* 04/28/2024 57.1  35.0 - 85.0 mg/dL Final  . LDL Calculated 04/28/2024 83  0 - 130 mg/dL Final  . VLDL Cholesterol 04/28/2024 23  mg/dL Final  . Cholesterol/HDL Ratio 04/28/2024 2.9   Final  . Color 04/28/2024 Light Yellow  Colorless, Straw, Light Yellow, Yellow, Dark Yellow Final  . Clarity 04/28/2024 Clear  Clear Final  . Specific Gravity 04/28/2024 1.019  1.005 - 1.030 Final  . pH, Urine 04/28/2024 6.5  5.0 - 8.0 Final  . Protein, Urinalysis 04/28/2024 Negative  Negative mg/dL Final  . Glucose, Urinalysis 04/28/2024 Negative  Negative mg/dL  Final  . Ketones, Urinalysis 04/28/2024 Negative  Negative mg/dL Final  . Blood, Urinalysis 04/28/2024 Negative  Negative Final  . Nitrite, Urinalysis 04/28/2024 Negative  Negative Final  . Leukocyte Esterase, Urinalysis 04/28/2024 3+ (!)  Negative Final  . Bilirubin, Urinalysis 04/28/2024 Negative  Negative Final  . Urobilinogen, Urinalysis 04/28/2024 0.2  0.2 - 1.0 mg/dL Final  . WBC, UA 92/78/7974 47 (H)  <=5 /hpf Final  . Red Blood Cells, Urinalysis 04/28/2024 1  <=3 /hpf Final  . Bacteria, Urinalysis 04/28/2024 0-5  0 - 5 /hpf Final  . Squamous Epithelial Cells, Urinaly* 04/28/2024 2  /hpf Final  . Hemoglobin A1C 04/28/2024 7.7 (H)  4.2 - 5.6 % Final  . Average Blood Glucose (Calc) 04/28/2024 174  mg/dL Final      LABS   Creatinine: 1.6 mg/dL   Glomerular Filtration Rate: 31 mL/min/1.73 m   Hemoglobin A1c: 7.7%   Total Cholesterol: 163 mg/dL   LDL Cholesterol: 83 mg/dL   HDL Cholesterol: 57 mg/dL   Urinalysis: Positive for leukocyte esterase and white blood cells present  Assessment/Plan:   Patient Self-Management and Personalized Health Advice The patient has been provided with information about: diet, exercise, weight management, fall prevention, and designing advance directives  During the course of the visit the patient was educated and counseled about appropriate screening and preventive services including:  COVID vaccine(s) Influenza vaccine Pneumococcal vaccine: 50+ RSV Vaccine--advised to get at pharmacy if Medicare coverage Shingrix vaccine (Herpes Zoster)-advised to get at pharmacy if Medicare coverage TdaP vaccine-advised to get at pharmacy if Medicare coverage Bone density screening Breast cancer screening Cardiovascular risk screening Fall Risk assessment done Advanced directives: has an advanced directive - a copy HAS NOT been provided  The patient's BMI is above the acceptable range; discussed or provided materials on diet/exercise Assessment &  Plan Chronic Kidney Disease Stage 3a Creatinine 1.6, GFR 31. High risk for contrast-induced nephropathy, contraindicating contrast CT. - Encourage increased hydration. - Consult urologist for CT necessity and alternatives. - Consider referral to another urologist if needed.  Urinary Tract Infection Dysuria with urinalysis showing leukocyte esterase and pyuria. - Prescribe Keflex .  Type 2 Diabetes Mellitus Hemoglobin A1c 7.7, likely elevated due to prednisone . Glycemic control critical.  Polymyalgia Rheumatica (PMR) Symptoms controlled on prednisone  2.5 mg every other day. - Continue prednisone  2.5 mg every other day.  General Health Maintenance Due for mammogram and vaccination updates. - Order mammogram. - Offer diabetic retinopathy screening.  Goals of Care Advanced directive filed at courthouse. - Insurance account manager of  advanced directive copy for clinic file. Diagnoses and all orders for this visit:  Annual physical exam -     Depression Screen -(PHQ- 2/9, BDI)  Depression screen -     Depression Screen -(PHQ- 2/9, BDI)  Medicare annual wellness visit, subsequent  Essential hypertension -     CBC w/auto Differential (5 Part); Future -     Comprehensive Metabolic Panel (CMP); Future  Type 2 diabetes mellitus without complication, without long-term current use of insulin (CMS/HHS-HCC) -     Urinalysis w/Microscopic; Future -     Hemoglobin A1C; Future  Hyperlipidemia due to type 2 diabetes mellitus  (CMS/HHS-HCC) -     Remote Retinal Imaging - OU - Both Eyes; Future -     Lipid Panel w/calc LDL; Future  Urothelial lesion  PMR (polymyalgia rheumatica) (CMS/HHS-HCC) -     Sedimentation Rate-Automated; Future  Stage 3b chronic kidney disease (CMS/HHS-HCC)  Encounter for screening mammogram for malignant neoplasm of breast -     Mammo screening digital bilateral; Future  Other orders -     Follow up in Primary Care -     cephalexin  (KEFLEX ) 250 MG capsule;  Take 1 capsule (250 mg total) by mouth 3 (three) times daily for 7 days -     Follow up in Primary Care; Future     This visit was coded based on medical decision making (MDM).     Return in about 6 months (around 11/05/2024) for reck with labs. Follow-up     Normal Orders This Visit   Follow up in Primary Care [[id number] Custom]    Questions:   Does this order need to be coordinated with another visit or should it be hidden from the patient portal? If yes to either, the patient will need to stop by the front desk or call to schedule.: No   Who is this follow-up with?: Me   Can this appointment be overbooked by a scheduler?: No   What type of follow up is needed?: Physical   What's the reason for follow up?:     Future Labs/Procedures Expected by Expires   Follow up in Primary Care [[id number] Custom]  11/05/2024 01/05/2025   Questions:     Does this order need to be coordinated with another visit or should it be hidden from the patient portal? If yes to either, the patient will need to stop by the front desk or call to schedule.: Yes   Who is this follow-up with?: Me   Can this appointment be overbooked by a scheduler?: No   What type of follow up is needed?: Office Visit   What's the reason for follow up?: Chronic Medical Problems   Allow telemedicine?: In Person Only         Future Appointments     Date/Time Provider Department Center Visit Type   08/11/2024 8:30 AM Callwood, Cara Endow, MD [facility] C FOLLOW UP   08/11/2024 11:15 AM Cherilyn Debby Quivers, MD [facility] CLI RETURN VISIT   08/19/2024 1:45 PM Tobie Lady Plumb, MD [facility] C RETURN VISIT   10/29/2024 8:30 AM [facility] LAB [facility] LAB   11/05/2024 10:30 AM Marikay Eva Hausen, PA [facility] OFFICE VISIT       An after visit summary was provided for the patient either in written format or through  MyChart.  This note has been created using automated tools and reviewed for  accuracy by MIRIAM KLEM MCLAUGHLIN.    *Some images could not be shown.

## 2024-05-12 ENCOUNTER — Ambulatory Visit: Admitting: Physician Assistant

## 2024-05-12 ENCOUNTER — Other Ambulatory Visit: Payer: Self-pay | Admitting: Physician Assistant

## 2024-05-12 DIAGNOSIS — N2889 Other specified disorders of kidney and ureter: Secondary | ICD-10-CM

## 2024-05-12 DIAGNOSIS — D4112 Neoplasm of uncertain behavior of left renal pelvis: Secondary | ICD-10-CM

## 2024-05-15 ENCOUNTER — Other Ambulatory Visit: Payer: Self-pay | Admitting: Urology

## 2024-05-15 ENCOUNTER — Telehealth: Payer: Self-pay

## 2024-05-15 DIAGNOSIS — D4112 Neoplasm of uncertain behavior of left renal pelvis: Secondary | ICD-10-CM

## 2024-05-15 MED ORDER — DIAZEPAM 10 MG PO TABS: ORAL_TABLET | ORAL | 0 refills | Status: DC

## 2024-05-15 NOTE — Telephone Encounter (Signed)
 Patient needed a valium  called in for her MRI scan tomorrow. Clotilda PA was able to call that in for patient.

## 2024-05-16 ENCOUNTER — Ambulatory Visit
Admission: RE | Admit: 2024-05-16 | Discharge: 2024-05-16 | Disposition: A | Discharge status: Home / Self Care | Source: Ambulatory Visit | Attending: Physician Assistant | Admitting: Physician Assistant

## 2024-05-16 DIAGNOSIS — N2889 Other specified disorders of kidney and ureter: Secondary | ICD-10-CM | POA: Insufficient documentation

## 2024-05-16 MED ORDER — GADOBUTROL 1 MMOL/ML IV SOLN
7.0000 mL | Freq: Once | INTRAVENOUS | Status: AC | PRN
  Administered 2024-05-16: 7 mL via INTRAVENOUS

## 2024-05-22 ENCOUNTER — Ambulatory Visit
Admission: RE | Admit: 2024-05-22 | Discharge: 2024-05-22 | Disposition: A | Discharge status: Home / Self Care | Source: Ambulatory Visit | Attending: Physician Assistant | Admitting: Physician Assistant

## 2024-05-22 DIAGNOSIS — Z1231 Encounter for screening mammogram for malignant neoplasm of breast: Secondary | ICD-10-CM | POA: Insufficient documentation

## 2024-05-23 ENCOUNTER — Other Ambulatory Visit

## 2024-05-25 ENCOUNTER — Ambulatory Visit: Payer: Self-pay | Admitting: Urology

## 2024-05-30 ENCOUNTER — Ambulatory Visit: Admitting: Urology

## 2024-06-03 ENCOUNTER — Ambulatory Visit: Admitting: Urology

## 2024-06-09 NOTE — Progress Notes (Unsigned)
 06/10/2024 3:58 PM   Valerie Cochran Mt Aug 01, 1933 [id number]  Referring provider: Marikay Eva POUR, PA [address] [facility] [address] [facility] [place],  [place] [address]  Urological history: 1.  Nephrolithiasis - CT urogram (09/2023) left lower pole staghorn calculus  2. Left filling defect - URS ([id number]) unable to adequately visualize or access the left lower pole moiety due to the severe infundibular stenosis, adjacent urothelium was biopsied as well as a cytology - cytology was acellular and biopsies were negative - CT urogram (09/2023) prominent filling defect in the lower pole calyceal of the left kidney  3.  Right adrenal mass - CT urogram (09/2023) calcified right adrenal mass  4. Bilateral renal cysts - CT urogram (09/2023) tiny hypodense lesions in both kidneys too small to characterize  5. Left severe infundibular stenosis   Chief Complaint  Patient presents with   Medical Management of Chronic Issues   HPI: Valerie Cochran is a 89 y.o. woman who presents today for imaging report.    Previous records reviewed.   MRI with and without performed on the abdomen on May 16, 2024 was somewhat limited by breath motion artifact.  But there was an unchanged appearance of the left kidney, notable for calyceal dilation and cortical atrophy of the inferior pole, staghorn calculus, multiple simple and thinly septated renal cortical cysts.  The filling defect could not be appreciated secondary to motion artifact.  The calcified right adrenal mass appears benign.  Urine cytology was negative for high-grade urothelial carcinoma in April 2024.   January's urine cytology was negative for high-grade urothelial carcinoma.  Serum creatinine was 1.6 with a EGFR of 31, hemoglobin A1c of 7.7 and UA was negative for micro heme in July.    PMH: Past Medical History:  Diagnosis Date   Aortic atherosclerosis (HCC)    Atypical chest pain    Diastolic dysfunction    a.) TTE 01/29/2015:  EF 45%, mild global HK, triv PR, mild AR/MR/TR, G1DD; b.) TTE 05/13/2018: EF >55%, mild panvalvular regurgitation; c.) TTE 02/08/2022: EF >55%, mild LVH, mild AR/TR/PR, mod MR, G1DD   GERD (gastroesophageal reflux disease)    Gout    History of kidney stones    HLD (hyperlipidemia)    Hypertension    Long term current use of antithrombotics/antiplatelets    a.) clopidogrel   Long term current use of immunosuppressive drug    a.) hydroxycholorquine + prednisone    Murmur    Nephrolithiasis    Osteopenia of multiple sites    Peripheral edema    Peripheral neuropathy    Polymyalgia rheumatica syndrome (HCC)    Right adrenal mass (HCC)    T2DM (type 2 diabetes mellitus) (HCC)    Temporal arteritis (HCC)     Surgical History: Past Surgical History:  Procedure Laterality Date   ARTERY BIOPSY Right 07/23/2019   Procedure: BIOPSY TEMPORAL ARTERY;  Surgeon: Tye Millet, DO;  Location: [facility];  Service: General;  Laterality: Right;   CYSTOSCOPY WITH BIOPSY Left 06/26/2022   Procedure: CYSTOSCOPY WITH RENAL PELVIS BIOPSY;  Surgeon: Penne Knee, MD;  Location: [facility];  Service: Urology;  Laterality: Left;   CYSTOSCOPY WITH FULGERATION Left 06/26/2022   Procedure: CYSTOSCOPY WITH LASER FULGERATION OF RENAL PELVIS LESION;  Surgeon: Penne Knee, MD;  Location: [facility];  Service: Urology;  Laterality: Left;   CYSTOSCOPY/URETEROSCOPY/HOLMIUM LASER/STENT PLACEMENT Left 06/26/2022   Procedure: CYSTOSCOPY/URETEROSCOPY/HOLMIUM LASER/STENT PLACEMENT;  Surgeon: Penne Knee, MD;  Location: [facility];  Service: Urology;  Laterality: Left;   SMALL INTESTINE SURGERY      Home Medications:  Allergies as of 06/10/2024       Reactions   Aspirin Hives, Itching   Ivp Dye [iodinated Contrast Media] Itching        Medication List        Accurate as of June 10, 2024 11:59 PM. If you have any questions, ask your nurse or doctor.          STOP taking these medications     clopidogrel 75 MG tablet Commonly known as: PLAVIX Stopped by: Gilberto Stanforth   diazepam  10 MG tablet Commonly known as: Valium  Stopped by: Trexton Escamilla   diphenhydrAMINE  50 MG tablet Commonly known as: BENADRYL  Stopped by: Bassem Bernasconi   fluconazole  150 MG tablet Commonly known as: DIFLUCAN  Stopped by: Cheron Pasquarelli   metFORMIN 500 MG 24 hr tablet Commonly known as: GLUCOPHAGE-XR Stopped by: Seryna Marek   oxybutynin  5 MG tablet Commonly known as: DITROPAN  Stopped by: Maha Fischel       TAKE these medications    alendronate 70 MG tablet Commonly known as: FOSAMAX Take 70 mg by mouth once a week. Take with a full glass of water on an empty stomach. Takes on Monday   cyclobenzaprine 10 MG tablet Commonly known as: FLEXERIL Take 10 mg by mouth 3 (three) times daily as needed for muscle spasms.   DULoxetine 30 MG capsule Commonly known as: CYMBALTA Take 30 mg by mouth daily.   fluticasone 50 MCG/ACT nasal spray Commonly known as: FLONASE Place 2 sprays into the nose daily.   furosemide 20 MG tablet Commonly known as: LASIX Take 20 mg by mouth daily.   gabapentin 100 MG capsule Commonly known as: NEURONTIN Take 100 mg by mouth at bedtime.   HYDROcodone -acetaminophen  5-325 MG tablet Commonly known as: NORCO/VICODIN Take 1-2 tablets by mouth every 6 (six) hours as needed for moderate pain.   hydroxychloroquine 200 MG tablet Commonly known as: PLAQUENIL Take 200 mg by mouth daily.   losartan 100 MG tablet Commonly known as: COZAAR Take 100 mg by mouth daily.   metoprolol succinate 50 MG 24 hr tablet Commonly known as: TOPROL-XL Take 50 mg by mouth daily.   mirtazapine 15 MG tablet Commonly known as: REMERON Take 15 mg by mouth at bedtime.   omeprazole 20 MG capsule Commonly known as: PRILOSEC Take 20 mg by mouth 2 (two) times daily.   potassium chloride SA 20 MEQ tablet Commonly known as: KLOR-CON M Take 20 mEq by mouth daily  as needed.   predniSONE  50 MG tablet Commonly known as: DELTASONE  Take 1 tablet 13 hours prior to scan, then 7 hours prior and 1 hour prior What changed:  how much to take how to take this when to take this Another medication with the same name was removed. Continue taking this medication, and follow the directions you see here.   rosuvastatin 5 MG tablet Commonly known as: CRESTOR Take 5 mg by mouth daily.   sulfamethoxazole -trimethoprim  800-160 MG tablet Commonly known as: BACTRIM  DS Take 1 tablet by mouth 2 (two) times daily.   traZODone 50 MG tablet Commonly known as: DESYREL Take 50 mg by mouth at bedtime as needed for sleep.   triamcinolone cream 0.5 % Commonly known as: KENALOG Apply 1 application topically 2 (two) times daily.        Allergies:  Allergies  Allergen Reactions   Aspirin Hives and Itching   Ivp Dye [Iodinated Contrast  Media] Itching    Family History: Family History  Problem Relation Age of Onset   Breast cancer Neg Hx     Social History: See HPI for pertinent social history  ROS: Pertinent ROS in HPI  Physical Exam: BP (!) 177/78 (BP Location: Left Arm, Patient Position: Sitting, Cuff Size: Normal)   Pulse 76   Ht 5' 6 (1.676 m)   Wt 176 lb 1.6 oz (79.9 kg)   BMI 28.42 kg/m   Constitutional:  Well nourished. Alert and oriented, No acute distress.  Laboratory Data: See EPIC and HPI  I have reviewed the labs.   Pertinent Imaging: CLINICAL DATA:  Suspected left renal lesion identified by prior CT   EXAM: MRI ABDOMEN WITHOUT AND WITH CONTRAST   TECHNIQUE: Multiplanar multisequence MR imaging of the abdomen was performed both before and after the administration of intravenous contrast.   CONTRAST:  7mL GADAVIST  GADOBUTROL  1 MMOL/ML IV SOLN   COMPARISON:  10/09/2023   FINDINGS: Examination is generally somewhat limited by breath motion artifact, which particularly limits multiphasic contrast enhanced sequences.    Lower chest: No acute abnormality.  Cardiomegaly.   Hepatobiliary: No solid liver abnormality is seen. Tiny gallstones. No gallbladder wall thickening, or biliary dilatation.   Pancreas: Unremarkable. No pancreatic ductal dilatation or surrounding inflammatory changes.   Spleen: Normal in size without significant abnormality.   Adrenals/Urinary Tract: Unchanged large, calcified right adrenal mass measuring 3.8 x 2.9 cm (series 12, image 12). Normal left adrenal. Unchanged appearance of the left kidney, notable for calyceal dilatation and cortical atrophy of the inferior pole, a staghorn calculus which is generally not well assessed by MR, and multiple simple and thinly septated renal cortical cysts. No mass or suspicious filling defect, noting limitations of motion artifact. Simple, benign right renal cortical cysts for which no further follow-up or characterization is required.   Stomach/Bowel: Stomach is within normal limits. No evidence of bowel wall thickening, distention, or inflammatory changes.   Vascular/Lymphatic: Aortic atherosclerosis. No enlarged abdominal lymph nodes.   Other: No abdominal wall hernia or abnormality. No ascites.   Musculoskeletal: No acute or significant osseous findings.   IMPRESSION: 1. Examination is generally somewhat limited by breath motion artifact, which particularly limits multiphasic contrast enhanced sequences. 2. Unchanged appearance of the left kidney, notable for calyceal dilatation and cortical atrophy of the inferior pole, a staghorn calculus which is generally not well assessed by MR, and multiple simple and thinly septated renal cortical cysts. No mass or suspicious filling defect, noting limitations of motion artifact. 3. Unchanged large, calcified right adrenal mass measuring 3.8 x 2.9 cm, benign. 4. Cholelithiasis. 5. Cardiomegaly.   Aortic Atherosclerosis (ICD10-I70.0).     Electronically Signed   By: Marolyn JONETTA Jaksch  M.D.   On: 05/22/2024 14:25 I have independently reviewed the films.  See HPI.    Assessment & Plan:    1. Filling defect in the left lower pole  - I had an emergency on the floor, so I was not able to see the patient in clinic today, but I did speak with her daughter about the results - Explained that her previous imaging modality was a CT urogram and this most recent imaging was an MRI.  Because of these differences, the same condition may appear more or less visible and with the motion degradation with the MRI.  At this time, the MRI did not demonstrate any worrisome lesions, but it is not the best imaging to evaluate for filling defects, so  I would still continue to monitor with imaging every six months.   We will try again for a CT urogram, if her renal function allows it - will check a BMP one week prior to scheduling imaging studies to see if it is safe to proceed with a CT urogram.    2. Right adrenal mass - appears benign on MRI, sight increase since 2023   Return in about 6 months (around 12/08/2024) for CT urogram report or MRI - pending BMP.  These notes generated with voice recognition software. I apologize for typographical errors.  Valerie Cochran  [facility] [address]  [address] [place], [place] [address] [phone]

## 2024-06-10 ENCOUNTER — Telehealth: Admitting: Urology

## 2024-06-10 ENCOUNTER — Ambulatory Visit (INDEPENDENT_AMBULATORY_CARE_PROVIDER_SITE_OTHER): Admitting: Urology

## 2024-06-10 ENCOUNTER — Encounter: Payer: Self-pay | Admitting: Urology

## 2024-06-10 VITALS — BP 177/78 | HR 76 | Ht 66.0 in | Wt 176.1 lb

## 2024-06-10 DIAGNOSIS — Z91041 Radiographic dye allergy status: Secondary | ICD-10-CM

## 2024-06-10 DIAGNOSIS — N898 Other specified noninflammatory disorders of vagina: Secondary | ICD-10-CM

## 2024-06-10 DIAGNOSIS — E278 Other specified disorders of adrenal gland: Secondary | ICD-10-CM

## 2024-06-10 DIAGNOSIS — D4112 Neoplasm of uncertain behavior of left renal pelvis: Secondary | ICD-10-CM

## 2024-06-10 DIAGNOSIS — R3129 Other microscopic hematuria: Secondary | ICD-10-CM

## 2024-06-29 ENCOUNTER — Other Ambulatory Visit: Payer: Self-pay | Admitting: Urology

## 2024-12-09 ENCOUNTER — Other Ambulatory Visit
